# Patient Record
Sex: Female | Born: 1965 | Race: Black or African American | Hispanic: No | Marital: Married | Smoking: Never smoker
Health system: Southern US, Community
[De-identification: ages and names within clinical notes are randomized; demographics above are authoritative.]

## PROBLEM LIST (undated history)

## (undated) DIAGNOSIS — J309 Allergic rhinitis, unspecified: Secondary | ICD-10-CM

## (undated) DIAGNOSIS — M62830 Muscle spasm of back: Secondary | ICD-10-CM

## (undated) DIAGNOSIS — J342 Deviated nasal septum: Secondary | ICD-10-CM

## (undated) DIAGNOSIS — Z9889 Other specified postprocedural states: Secondary | ICD-10-CM

## (undated) DIAGNOSIS — J019 Acute sinusitis, unspecified: Secondary | ICD-10-CM

## (undated) DIAGNOSIS — I471 Supraventricular tachycardia, unspecified: Secondary | ICD-10-CM

## (undated) DIAGNOSIS — J341 Cyst and mucocele of nose and nasal sinus: Secondary | ICD-10-CM

## (undated) DIAGNOSIS — T50905A Adverse effect of unspecified drugs, medicaments and biological substances, initial encounter: Secondary | ICD-10-CM

## (undated) DIAGNOSIS — K319 Disease of stomach and duodenum, unspecified: Secondary | ICD-10-CM

## (undated) DIAGNOSIS — Z6835 Body mass index (BMI) 35.0-35.9, adult: Secondary | ICD-10-CM

## (undated) DIAGNOSIS — J020 Streptococcal pharyngitis: Secondary | ICD-10-CM

## (undated) DIAGNOSIS — L732 Hidradenitis suppurativa: Secondary | ICD-10-CM

## (undated) DIAGNOSIS — I10 Essential (primary) hypertension: Secondary | ICD-10-CM

## (undated) DIAGNOSIS — M549 Dorsalgia, unspecified: Secondary | ICD-10-CM

## (undated) DIAGNOSIS — R7301 Impaired fasting glucose: Secondary | ICD-10-CM

## (undated) DIAGNOSIS — J029 Acute pharyngitis, unspecified: Secondary | ICD-10-CM

## (undated) DIAGNOSIS — E559 Vitamin D deficiency, unspecified: Secondary | ICD-10-CM

## (undated) HISTORY — DX: Impaired fasting glucose: R73.01

## (undated) HISTORY — DX: Hidradenitis suppurativa: L73.2

## (undated) HISTORY — DX: Acute pharyngitis, unspecified: J02.9

## (undated) HISTORY — DX: Body mass index (BMI) 35.0-35.9, adult: Z68.35

## (undated) HISTORY — DX: Adverse effect of unspecified drugs, medicaments and biological substances, initial encounter: K31.9

## (undated) HISTORY — DX: Vitamin D deficiency, unspecified: E55.9

## (undated) HISTORY — DX: Streptococcal pharyngitis: J02.0

## (undated) HISTORY — DX: Muscle spasm of back: M62.830

## (undated) HISTORY — DX: Deviated nasal septum: J34.2

## (undated) HISTORY — DX: Adverse effect of unspecified drugs, medicaments and biological substances, initial encounter: T50.905A

## (undated) HISTORY — PX: ABDOMINAL HYSTERECTOMY: SHX81

## (undated) HISTORY — DX: Allergic rhinitis, unspecified: J30.9

## (undated) HISTORY — DX: Supraventricular tachycardia, unspecified: I47.10

## (undated) HISTORY — DX: Cyst and mucocele of nose and nasal sinus: J34.1

## (undated) HISTORY — DX: Acute sinusitis, unspecified: J01.90

## (undated) HISTORY — DX: Dorsalgia, unspecified: M54.9

---

## 2006-12-18 ENCOUNTER — Emergency Department (HOSPITAL_COMMUNITY): Admission: EM | Admit: 2006-12-18 | Discharge: 2006-12-18 | Payer: Self-pay | Admitting: Emergency Medicine

## 2017-04-20 ENCOUNTER — Emergency Department (HOSPITAL_COMMUNITY)

## 2017-04-20 ENCOUNTER — Emergency Department (HOSPITAL_COMMUNITY)
Admission: EM | Admit: 2017-04-20 | Discharge: 2017-04-20 | Disposition: A | Discharge status: Home / Self Care | Attending: Emergency Medicine | Admitting: Emergency Medicine

## 2017-04-20 ENCOUNTER — Encounter (HOSPITAL_COMMUNITY): Payer: Self-pay | Admitting: Neurology

## 2017-04-20 DIAGNOSIS — L02411 Cutaneous abscess of right axilla: Secondary | ICD-10-CM | POA: Diagnosis not present

## 2017-04-20 DIAGNOSIS — L732 Hidradenitis suppurativa: Secondary | ICD-10-CM | POA: Insufficient documentation

## 2017-04-20 DIAGNOSIS — I10 Essential (primary) hypertension: Secondary | ICD-10-CM | POA: Insufficient documentation

## 2017-04-20 DIAGNOSIS — L02419 Cutaneous abscess of limb, unspecified: Secondary | ICD-10-CM

## 2017-04-20 DIAGNOSIS — R002 Palpitations: Secondary | ICD-10-CM | POA: Diagnosis present

## 2017-04-20 HISTORY — DX: Essential (primary) hypertension: I10

## 2017-04-20 LAB — BASIC METABOLIC PANEL
ANION GAP: 7 (ref 5–15)
BUN: 12 mg/dL (ref 6–20)
CHLORIDE: 105 mmol/L (ref 101–111)
CO2: 27 mmol/L (ref 22–32)
Calcium: 9.2 mg/dL (ref 8.9–10.3)
Creatinine, Ser: 0.79 mg/dL (ref 0.44–1.00)
GFR calc Af Amer: >60 mL/min (ref >60)
GLUCOSE: 96 mg/dL (ref 65–99)
POTASSIUM: 3.3 mmol/L — AB (ref 3.5–5.1)
SODIUM: 139 mmol/L (ref 135–145)

## 2017-04-20 LAB — CBC
HEMATOCRIT: 37.3 % (ref 36.0–46.0)
HEMOGLOBIN: 12.6 g/dL (ref 12.0–15.0)
MCH: 27.8 pg (ref 26.0–34.0)
MCHC: 33.8 g/dL (ref 30.0–36.0)
MCV: 82.2 fL (ref 78.0–100.0)
Platelets: 207 10*3/uL (ref 150–400)
RBC: 4.54 MIL/uL (ref 3.87–5.11)
RDW: 12.7 % (ref 11.5–15.5)
WBC: 9.4 10*3/uL (ref 4.0–10.5)

## 2017-04-20 LAB — I-STAT TROPONIN, ED: Troponin i, poc: 0 ng/mL (ref 0.00–0.08)

## 2017-04-20 MED ORDER — CLINDAMYCIN HCL 150 MG PO CAPS: 150.0000 mg | ORAL_CAPSULE | Freq: Four times a day (QID) | ORAL | 0 refills | Status: DC

## 2017-04-20 NOTE — Discharge Instructions (Signed)
Take all antibiotics as prescribed Use warm compresses as frequently as possible and right underarm area. Return if you are having increased redness, swelling, or pain. Call number on discharge instructions to tablets primary care.

## 2017-04-20 NOTE — ED Triage Notes (Signed)
Pt reports palpations x 1 week, with "deep" cp when it occurs. Denies any pain now. Is on metoprolol, same dose, but pill size got larger recently, no change in dose. Is also here for boil under right arm, draining, not getting better.

## 2017-04-20 NOTE — ED Provider Notes (Addendum)
[facility] EMERGENCY DEPARTMENT Provider Note   CSN: [id number] Arrival date & time: 04/20/17  1256     History   Chief Complaint Chief Complaint  Patient presents with  . Palpitations    HPI Annette Dunn is a 51 y.o. female.  HPI 51 year old female history of hypertension and hidradenitis presents today complaining of palpitations.  They have been present over about a month.  They come and go and last seconds.  She has no associated dyspnea.  She has some occasional sharp pains in her chest.  She did have these in the past and had a monitor placed that did not show anything.  She also is complaining of some swelling and drainage under her right arm.  She has a history of hidradenitis and had surgery over 10 years ago.  She has noticed some ongoing swelling over the past couple months and is now having some drainage.  She has not had any fever or chills.  There is some associated pain.  She is concerned because of the bloody drainage.  She has recently moved to the area and does not have medical care here.  She was previously a Hotel managermilitary dependent.  Headache, head injury, dyspnea, abdominal pain, fever, chills, red streaking, or history of sepsis Past Medical History:  Diagnosis Date  . Hypertension     There are no active problems to display for this patient.   Past Surgical History:  Procedure Laterality Date  . ABDOMINAL HYSTERECTOMY      OB History    No data available       Home Medications    Prior to Admission medications   Not on File    Family History No family history on file.  Social History Social History  Substance Use Topics  . Smoking status: Never Smoker  . Smokeless tobacco: Not on file  . Alcohol use No     Allergies   Doxycycline   Review of Systems Review of Systems  All other systems reviewed and are negative.    Physical Exam Updated Vital Signs BP (!) 151/80 (BP Location: Right Arm)   Pulse 89   Temp  98.9 F (37.2 C) (Oral)   Resp 18   Ht 1.626 m (5\' 4" )   Wt 88.9 kg (196 lb)   SpO2 99%   BMI 33.64 kg/m   Physical Exam  Constitutional: She is oriented to person, place, and time. She appears well-developed and well-nourished. No distress.  HENT:  Head: Normocephalic and atraumatic.  Right Ear: External ear normal.  Left Ear: External ear normal.  Nose: Nose normal.  Eyes: Pupils are equal, round, and reactive to light. Conjunctivae and EOM are normal.  Neck: Normal range of motion. Neck supple.  Cardiovascular: Normal rate, regular rhythm, normal heart sounds and intact distal pulses.   Pulmonary/Chest: Effort normal.  Abdominal: Soft. Bowel sounds are normal.  Musculoskeletal: Normal range of motion.  Neurological: She is alert and oriented to person, place, and time. She exhibits normal muscle tone. Coordination normal.  Skin: Skin is warm and dry.  Scarring in right axilla noted.  Area that is open and draining without underlying fluctuance.  There is a small area with a head inferior to this.  There is no significant erythema or warmth.  Psychiatric: She has a normal mood and affect. Her behavior is normal. Thought content normal.  Nursing note and vitals reviewed.    ED Treatments / Results  Labs (all labs ordered  are listed, but only abnormal results are displayed) Labs Reviewed  BASIC METABOLIC PANEL - Abnormal; Notable for the following:       Result Value   Potassium 3.3 (*)    All other components within normal limits  CBC  I-STAT TROPONIN, ED    EKG  EKG Interpretation  Date/Time:  Wednesday April 20 2017 13:14:48 EDT Ventricular Rate:  99 PR Interval:  152 QRS Duration: 82 QT Interval:  348 QTC Calculation: 446 R Axis:   46 Text Interpretation:  Normal sinus rhythm Normal ECG Confirmed by Margarita Grizzle [phone]) on 04/20/2017 4:36:55 PM       Radiology Dg Chest 2 View  Result Date: 04/20/2017 CLINICAL DATA:  Palpitations over the past 1-1/2  weeks. Current history of hypertension. EXAM: CHEST  2 VIEW COMPARISON:  None. FINDINGS: Cardiomediastinal silhouette unremarkable. Lungs clear. Bronchovascular markings normal. Pulmonary vascularity normal. No visible pleural effusions. No pneumothorax. Visualized bony thorax intact. IMPRESSION: No acute cardiopulmonary disease. Electronically Signed   By: Hulan Saas M.D.   On: 04/20/2017 13:36    Procedures Procedures (including critical care time)  Medications Ordered in ED Medications - No data to display   Initial Impression / Assessment and Plan / ED Course  I have reviewed the triage vital signs and the nursing notes.  Pertinent labs & imaging results that were available during my care of the patient were reviewed by me and considered in my medical decision making (see chart for details).     1 palpitations patient with normal sinus rhythm here.  Chest x-Kelsa Jaworowski is clear.  I have low index of suspicion for this being due to any cardiology or cardiac disease.  She describes simple palpitations.  I discussed possible etiologies and need for follow-up.  She will be referred to cardiology.  We have discussed return precautions such as shortness of breath or chest pain and need for close follow-up and she voiced understanding. 2 hidradenitis with abscess.  Discussed with patient and had shared decision making conversation regarding I&D now or treat with antibiotics.  She would prefer to treat with antibiotics and referral for follow-up.  Has been allergic to doxycycline in the past.  Also discussed therapy with warm compresses and return precautions and she voices understanding. Final Clinical Impressions(s) / ED Diagnoses   Final diagnoses:  Palpitations  Axillary abscess  Hydradenitis    New Prescriptions New Prescriptions   CLINDAMYCIN (CLEOCIN) 150 MG CAPSULE    Take 1 capsule (150 mg total) by mouth every 6 (six) hours.     Margarita Grizzle, MD 04/20/17 [place]    Margarita Grizzle, MD 04/20/17 [phone]

## 2019-02-10 IMAGING — CR DG CHEST 2V
2 series · 2 of 2 positions shown · non-contrast
Comparison: None.

CLINICAL DATA: Palpitations over the past 1-1/2 weeks. Current
history of hypertension.

EXAM:
CHEST  2 VIEW

[chest pa]
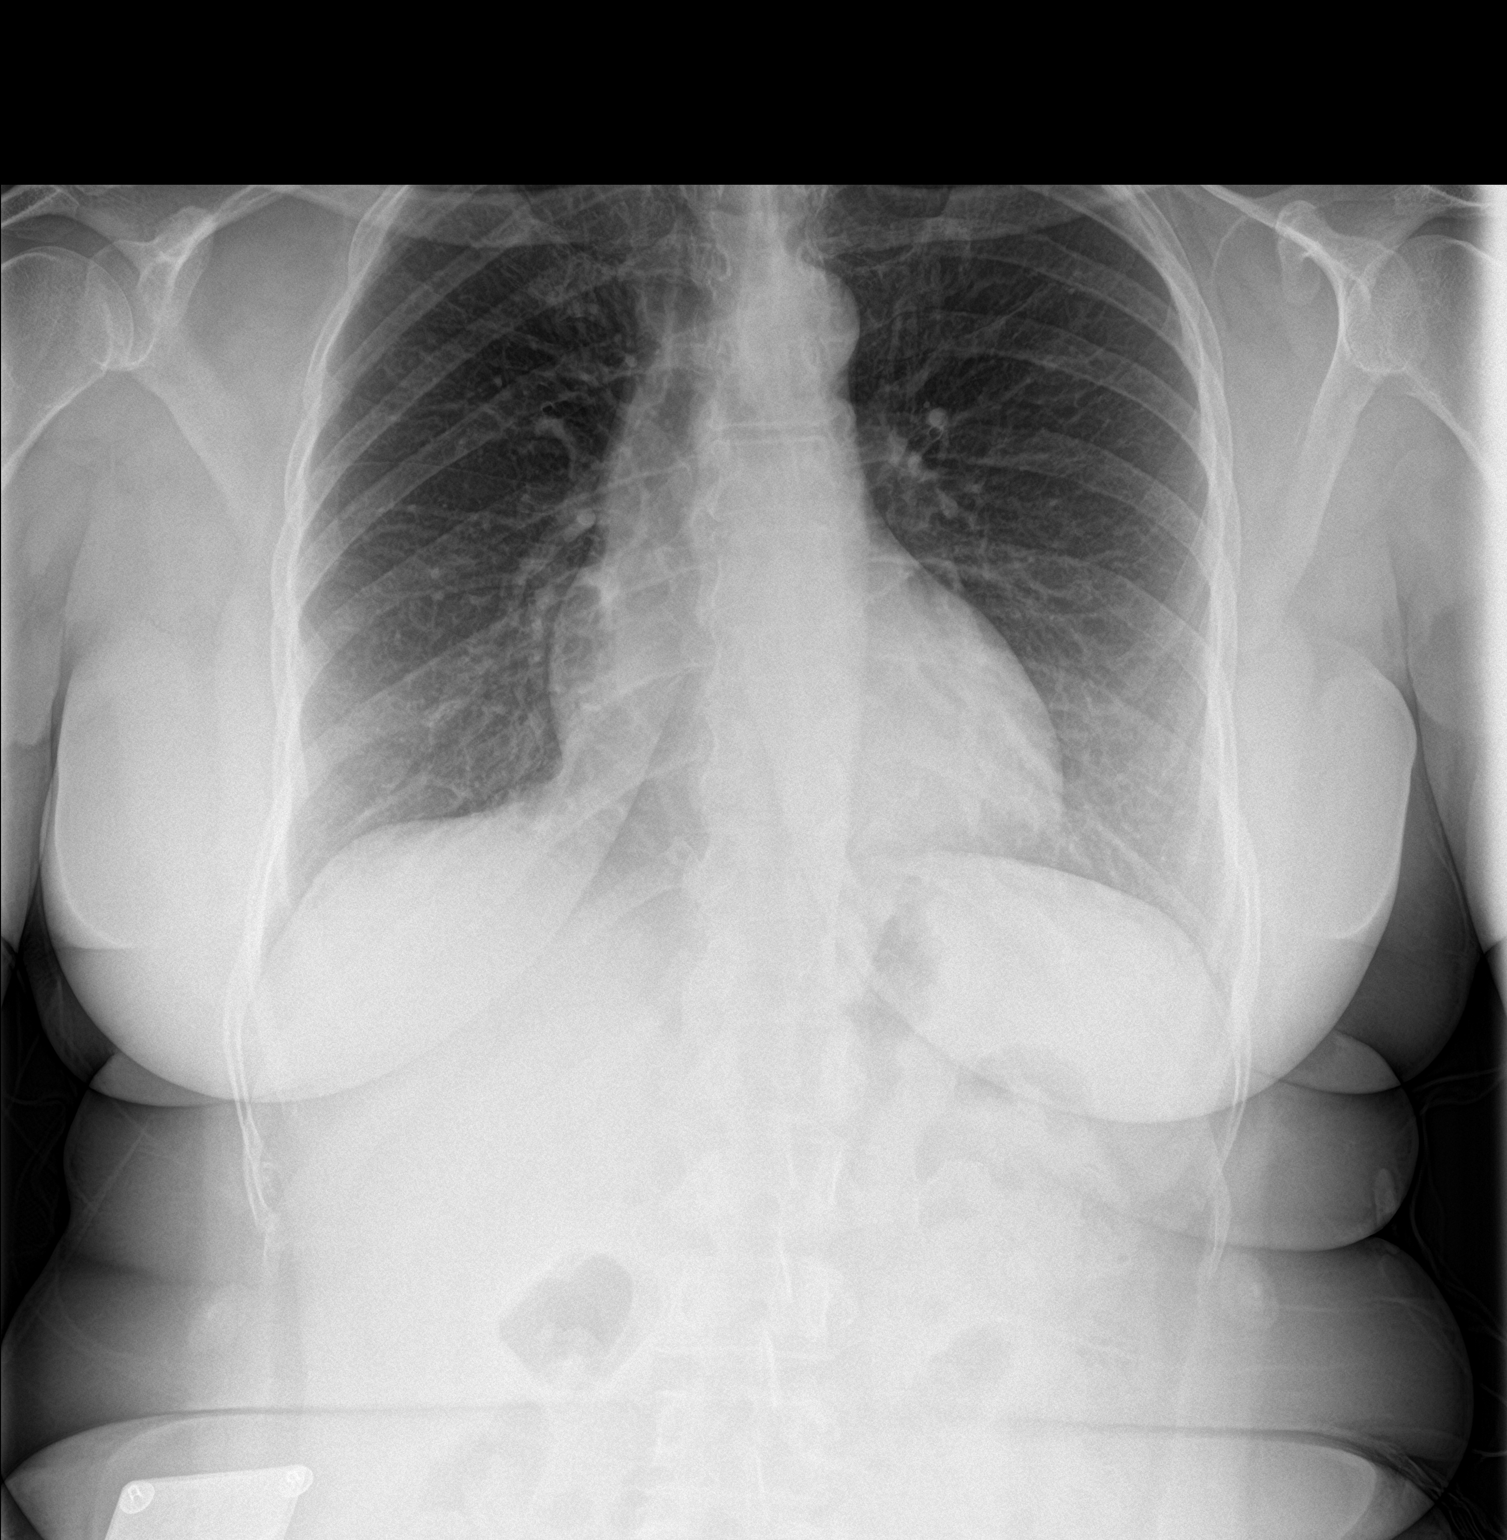

[chest lat]
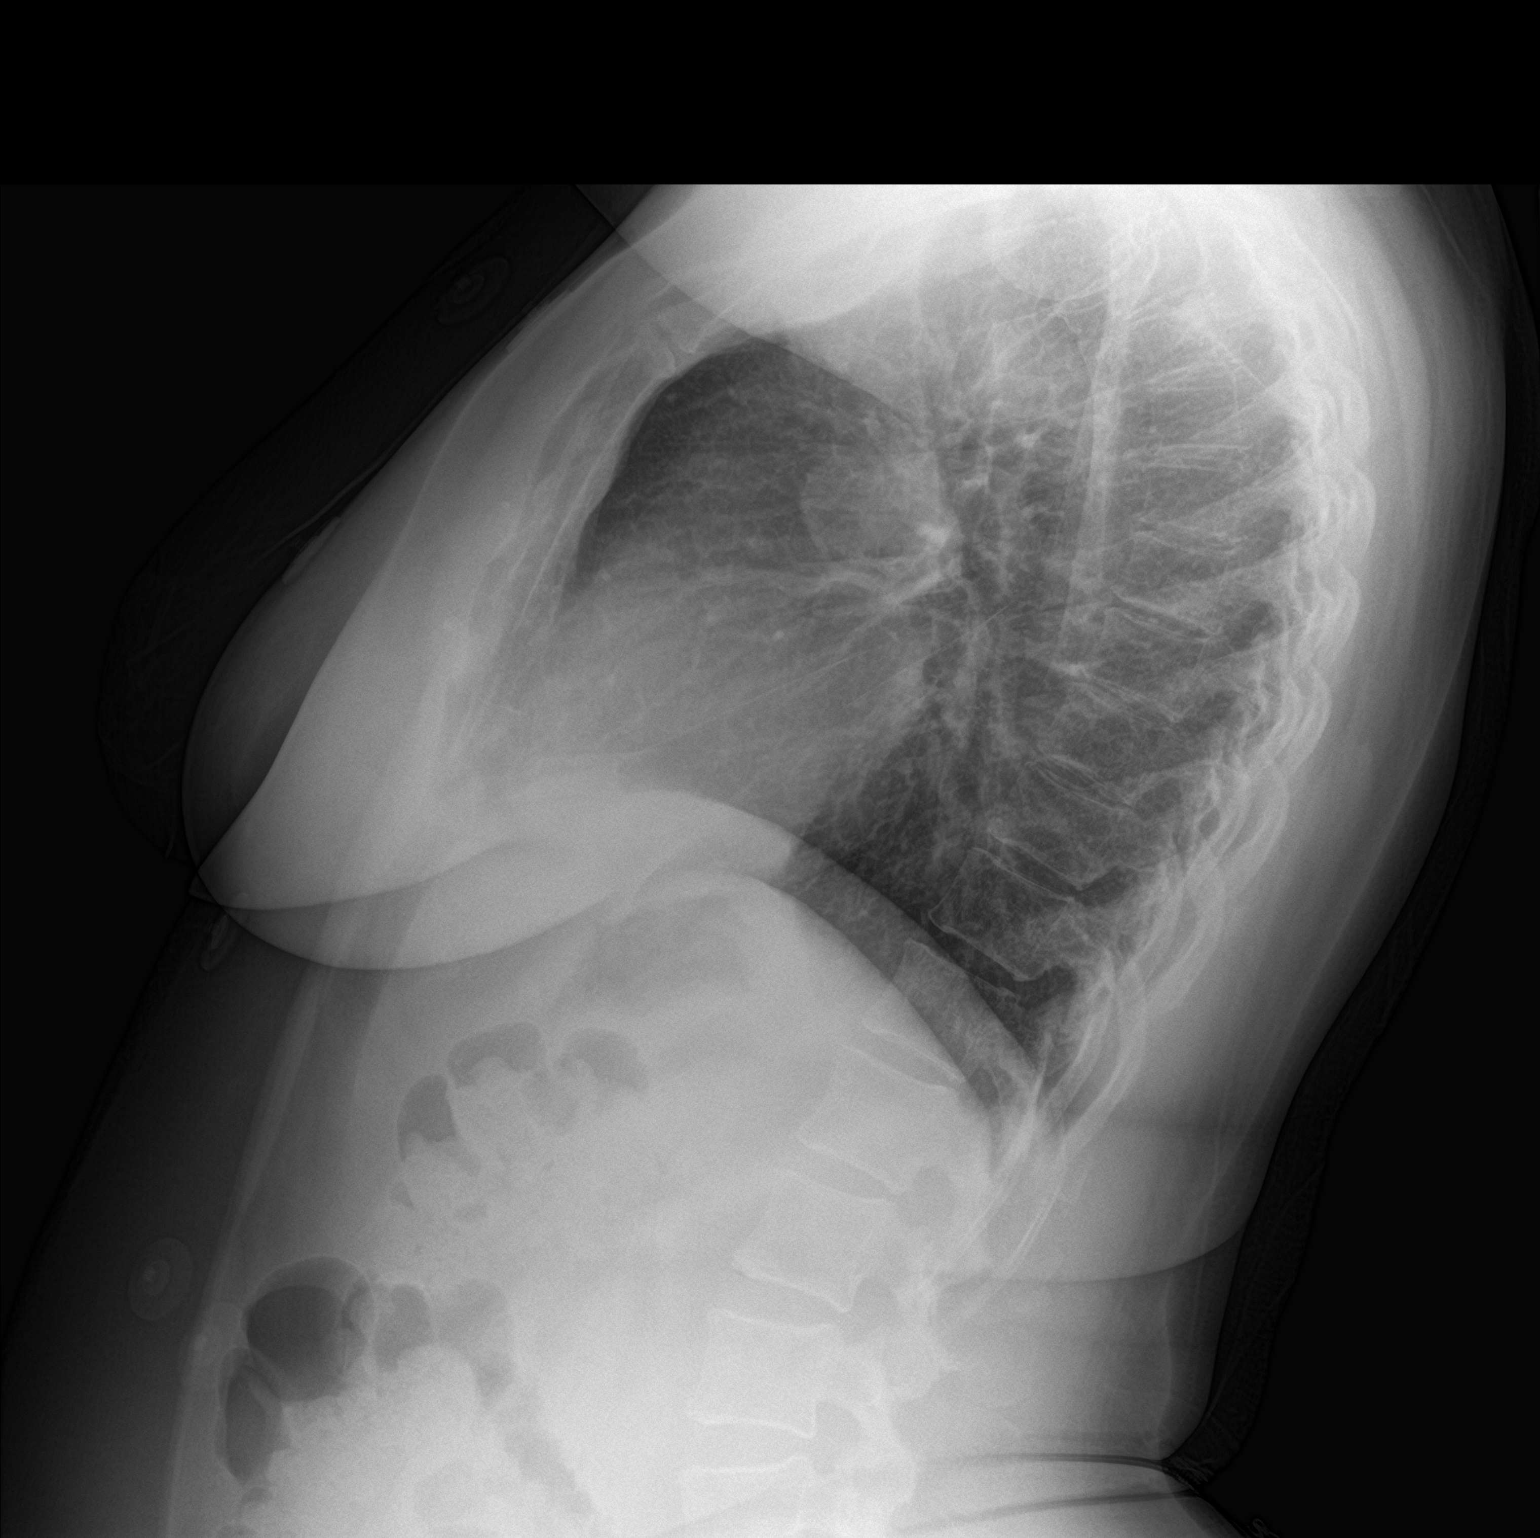

[2 of 2 positions shown; findings below may reference images not displayed]

FINDINGS: Cardiomediastinal silhouette unremarkable. Lungs clear.
Bronchovascular markings normal. Pulmonary vascularity normal. No
visible pleural effusions. No pneumothorax. Visualized bony thorax
intact.
IMPRESSION: No acute cardiopulmonary disease.

## 2020-11-18 NOTE — Patient Instructions (Signed)

## 2020-11-19 ENCOUNTER — Encounter: Payer: Self-pay | Admitting: Nurse Practitioner

## 2020-11-19 ENCOUNTER — Ambulatory Visit (INDEPENDENT_AMBULATORY_CARE_PROVIDER_SITE_OTHER): Admitting: Nurse Practitioner

## 2020-11-19 ENCOUNTER — Other Ambulatory Visit: Payer: Self-pay

## 2020-11-19 VITALS — BP 156/85 | HR 73 | Ht 64.0 in | Wt 197.0 lb

## 2020-11-19 DIAGNOSIS — R7989 Other specified abnormal findings of blood chemistry: Secondary | ICD-10-CM

## 2020-11-19 NOTE — Progress Notes (Signed)
11/20/2020     Endocrinology Consult Note    Subjective:    Patient ID: Annette Dunn, female    DOB: [birth year], PCP Jonathon Bellows, DO.   Past Medical History:  Diagnosis Date  . Hypertension     Past Surgical History:  Procedure Laterality Date  . ABDOMINAL HYSTERECTOMY      Social History   Socioeconomic History  . Marital status: Married    Spouse name: Not on file  . Number of children: Not on file  . Years of education: Not on file  . Highest education level: Not on file  Occupational History  . Not on file  Tobacco Use  . Smoking status: Never Smoker  . Smokeless tobacco: Not on file  Substance and Sexual Activity  . Alcohol use: No  . Drug use: Not on file  . Sexual activity: Not on file  Other Topics Concern  . Not on file  Social History Narrative  . Not on file   Social Determinants of Health   Financial Resource Strain: Not on file  Food Insecurity: Not on file  Transportation Needs: Not on file  Physical Activity: Not on file  Stress: Not on file  Social Connections: Not on file    Family History  Problem Relation Age of Onset  . Thyroid disease Mother   . Hypertension Mother   . Thyroid disease Father   . Hypertension Father     Outpatient Encounter Medications as of 11/19/2020  Medication Sig  . hydrochlorothiazide (HYDRODIURIL) 25 MG tablet daily.  . metoprolol succinate (TOPROL-XL) 50 MG 24 hr tablet Take by mouth daily.  . montelukast (SINGULAIR) 10 MG tablet daily.  . Multiple Vitamin (MULTIVITAMIN) tablet Take 1 tablet by mouth daily.  . [DISCONTINUED] clindamycin (CLEOCIN) 150 MG capsule Take 1 capsule (150 mg total) by mouth every 6 (six) hours.   No facility-administered encounter medications on file as of 11/19/2020.    ALLERGIES: Allergies  Allergen Reactions  . Doxycycline Rash    VACCINATION STATUS: Immunization History  Administered Date(s) Administered  . PFIZER(Purple Top)SARS-COV-2 Vaccination  08/24/2019, 09/14/2019     HPI  Annette Dunn is 55 y.o. female who presents today with a medical history as above. she is being seen in consultation for hyperthyroidism requested by Jonathon Bellows, DO.  she has been dealing with symptoms of intermittent palpitations, vertigo, anxiety on and off since 2004 when she had her first incidence of suppressed TSH.  her most recent thyroid labs revealed suppressed TSH of 0.22 on 10/12/20.  She has moved all over the country due to her husband being in the [employer].  She reports seeing several different specialists and undergoing several different tests for her thyroid.  She says she has had multiple uptake and scans in the past which did not show hyperthyroidism and she has had thyroid ultrasound showing suspicious nodule which needed FNA which came back inconclusive due to inadequate tissue sampling.  None of these records are available to view today.  She will call and obtain records of these procedures for our review.  she denies dysphagia, choking, shortness of breath, no recent voice change.    she does have family history of thyroid dysfunction in her dad and daughter (both hyperthyroidism needing RAI ablation), but denies family hx of thyroid cancer. she denies personal history of goiter. she is not on any anti-thyroid medications nor on any thyroid hormone supplements. Denies use of Biotin containing supplements.  she is  willing to proceed with appropriate work up and therapy for thyrotoxicosis.   Review of systems  Constitutional: + Minimally fluctuating body weight, current Body mass index is 33.81 kg/m., no fatigue, no subjective hyperthermia, no subjective hypothermia Eyes: no blurry vision, no xerophthalmia ENT: no sore throat, no nodules palpated in throat, no dysphagia/odynophagia, + intermittent hoarseness Cardiovascular: no chest pain, no shortness of breath, + intermittent palpitations, no leg swelling Respiratory: no cough, no shortness of  breath Gastrointestinal: no nausea/vomiting/diarrhea Musculoskeletal: no muscle/joint aches Skin: no rashes, no hyperemia Neurological: no tremors, no numbness, no tingling, no dizziness (has recently had bouts of vertigo) Psychiatric: no depression, + anxiety   Objective:    BP (!) 156/85   Pulse 73   Ht 5\' 4"  (1.626 m)   Wt 197 lb (89.4 kg)   BMI 33.81 kg/m   Wt Readings from Last 3 Encounters:  11/19/20 197 lb (89.4 kg)  04/20/17 196 lb (88.9 kg)     BP Readings from Last 3 Encounters:  11/19/20 (!) 156/85  04/20/17 (!) 145/79                        Physical Exam- Limited  Constitutional:  Body mass index is 33.81 kg/m. , not in acute distress, normal state of mind Eyes:  EOMI, no exophthalmos Neck: Supple Thyroid: + mild goiter Cardiovascular: RRR, no murmurs, rubs, or gallops, no edema Respiratory: Adequate breathing efforts, no crackles, rales, rhonchi, or wheezing Musculoskeletal: no gross deformities, strength intact in all four extremities, no gross restriction of joint movements Skin:  no rashes, no hyperemia Neurological: no tremor with outstretched hands   CMP     Component Value Date/Time   NA 139 04/20/2017 1317   K 3.3 (L) 04/20/2017 1317   CL 105 04/20/2017 1317   CO2 27 04/20/2017 1317   GLUCOSE 96 04/20/2017 1317   BUN 12 04/20/2017 1317   CREATININE 0.79 04/20/2017 1317   CALCIUM 9.2 04/20/2017 1317   GFRNONAA >60 04/20/2017 1317   GFRAA >60 04/20/2017 1317     CBC    Component Value Date/Time   WBC 9.4 04/20/2017 1317   RBC 4.54 04/20/2017 1317   HGB 12.6 04/20/2017 1317   HCT 37.3 04/20/2017 1317   PLT 207 04/20/2017 1317   MCV 82.2 04/20/2017 1317   MCH 27.8 04/20/2017 1317   MCHC 33.8 04/20/2017 1317   RDW 12.7 04/20/2017 1317     Diabetic Labs (most recent): No results found for: HGBA1C  Lipid Panel  No results found for: CHOL, TRIG, HDL, CHOLHDL, VLDL, LDLCALC, LDLDIRECT, LABVLDL   No results found for: TSH,  FREET4      Assessment & Plan:   1. Abnormal TSH  she is being seen at a kind request of 04/22/2017, DO. her history and most recent labs are reviewed, and she was examined clinically. However, more information is needed to determine the etiology and severity of her thyroid dysfunction.  I will repeat her thyroid function tests today, including antibody testing for autoimmune thyroid dysfunction.  She is asked to have her previous endocrinology records sent to our office to review to prevent unnecessary testing.  I did order repeat thyroid ultrasound given her history of thyroid nodule requiring FNA biopsy and mild goiter.  The potential risks of untreated thyrotoxicosis and the need for definitive therapy have been discussed in detail with her, and she agrees to proceed with diagnostic workup and treatment plan.  Options of therapy for hyperthyroidism are discussed with her.  We discussed the option of treating it with medications including methimazole or PTU which may have side effects including rash, transaminitis, and bone marrow suppression.  We also discussed the option of definitive therapy with RAI ablation of the thyroid. If she is found to have primary hyperthyroidism from Graves' disease , toxic multinodular goiter or toxic nodular goiter the preferred modality of treatment would be I-131 thyroid ablation. Surgery is another choice of treatment in some cases, in her case surgery is not a good fit for presentation with only mild goiter.  -Patient is made aware of the high likelihood of post ablative hypothyroidism with subsequent need for lifelong thyroid hormone replacement. sheunderstands this outcome and she is  willing to proceed.      she will return in 3 weeks for treatment decision.  I did not initiate any new prescriptions today.    -Patient is advised to maintain close follow up with Jonathon Bellows, DO for primary care needs.   - Time spent with the patient: 60  minutes, of which >50% was spent in obtaining information about her symptoms, reviewing her previous labs, evaluations, and treatments, counseling her about her hyperthyroidism , and developing a plan to confirm the diagnosis and long term treatment as necessary. Please refer to "Patient Self Inventory" in the Media tab for reviewed elements of pertinent patient history.  Annette Dunn participated in the discussions, expressed understanding, and voiced agreement with the above plans.  All questions were answered to her satisfaction. she is encouraged to contact clinic should she have any questions or concerns prior to her return visit.   Follow up plan: Return in about 3 weeks (around 12/10/2020) for Thyroid follow up, Previsit labs, thyroid ultrasound.   Thank you for involving me in the care of this pleasant patient, and I will continue to update you with her progress.    Ronny Bacon, [facility] [address] [place], [place] [address] Phone: [phone] Fax: [phone]  11/20/2020, 11:19 AM

## 2020-11-23 LAB — THYROID PEROXIDASE ANTIBODY: Thyroperoxidase Ab SerPl-aCnc: <8 IU/mL (ref 0–34)

## 2020-11-23 LAB — TSH: TSH: 0.833 u[IU]/mL (ref 0.450–4.500)

## 2020-11-23 LAB — THYROGLOBULIN ANTIBODY: Thyroglobulin Antibody: <1 IU/mL (ref 0.0–0.9)

## 2020-11-23 LAB — T3, FREE: T3, Free: 3.3 pg/mL (ref 2.0–4.4)

## 2020-11-23 LAB — T4, FREE: Free T4: 1.16 ng/dL (ref 0.82–1.77)

## 2020-12-15 ENCOUNTER — Ambulatory Visit: Admitting: Nurse Practitioner

## 2020-12-29 ENCOUNTER — Ambulatory Visit: Payer: Self-pay | Admitting: Nurse Practitioner

## 2021-01-23 ENCOUNTER — Ambulatory Visit: Payer: Self-pay | Admitting: Nurse Practitioner

## 2021-01-26 ENCOUNTER — Ambulatory Visit: Payer: Self-pay | Admitting: Nurse Practitioner

## 2021-02-09 ENCOUNTER — Ambulatory Visit (INDEPENDENT_AMBULATORY_CARE_PROVIDER_SITE_OTHER): Admitting: Nurse Practitioner

## 2021-02-09 ENCOUNTER — Encounter: Payer: Self-pay | Admitting: Nurse Practitioner

## 2021-02-09 ENCOUNTER — Other Ambulatory Visit: Payer: Self-pay

## 2021-02-09 VITALS — BP 153/95 | HR 75 | Ht 64.0 in | Wt 199.0 lb

## 2021-02-09 DIAGNOSIS — R7989 Other specified abnormal findings of blood chemistry: Secondary | ICD-10-CM

## 2021-02-09 NOTE — Progress Notes (Signed)
02/09/2021     Endocrinology Follow Up Note    Subjective:    Patient ID: Annette Dunn, female    DOB: 15-Jul-1965, PCP Annette Bellows, DO.   Past Medical History:  Diagnosis Date   Hypertension     Past Surgical History:  Procedure Laterality Date   ABDOMINAL HYSTERECTOMY      Social History   Socioeconomic History   Marital status: Married    Spouse name: Not on file   Number of children: Not on file   Years of education: Not on file   Highest education level: Not on file  Occupational History   Not on file  Tobacco Use   Smoking status: Never   Smokeless tobacco: Not on file  Substance and Sexual Activity   Alcohol use: No   Drug use: Not on file   Sexual activity: Not on file  Other Topics Concern   Not on file  Social History Narrative   Not on file   Social Determinants of Health   Financial Resource Strain: Not on file  Food Insecurity: Not on file  Transportation Needs: Not on file  Physical Activity: Not on file  Stress: Not on file  Social Connections: Not on file    Family History  Problem Relation Age of Onset   Thyroid disease Mother    Hypertension Mother    Thyroid disease Father    Hypertension Father     Outpatient Encounter Medications as of 02/09/2021  Medication Sig   hydrochlorothiazide (HYDRODIURIL) 25 MG tablet daily.   metoprolol succinate (TOPROL-XL) 50 MG 24 hr tablet Take by mouth daily.   montelukast (SINGULAIR) 10 MG tablet daily as needed.   Multiple Vitamin (MULTIVITAMIN) tablet Take 1 tablet by mouth daily.   No facility-administered encounter medications on file as of 02/09/2021.    ALLERGIES: Allergies  Allergen Reactions   Doxycycline Rash    VACCINATION STATUS: Immunization History  Administered Date(s) Administered   PFIZER(Purple Top)SARS-COV-2 Vaccination 08/24/2019, 09/14/2019     HPI  Annette Dunn is 55 y.o. female who presents today with a medical history as above. she is being seen  in follow up after being seen in consultation for Annette Dunn requested by Annette Bellows, DO.  she has been dealing with symptoms of intermittent palpitations, vertigo, anxiety on and off since 2004 when she had her first incidence of suppressed TSH.  her most recent thyroid labs revealed suppressed TSH of 0.22 on 10/12/20.  She has moved all over the country due to her husband being in the [employer].  She reports seeing several different specialists and undergoing several different tests for her thyroid.  She says she has had multiple uptake and scans in the past which did not show Annette Dunn and she has had thyroid ultrasound showing suspicious nodule which needed FNA which came back inconclusive due to inadequate tissue sampling.  None of these records are available to view today.  She will call and obtain records of these procedures for our review.  she denies dysphagia, choking, shortness of breath, no recent voice change.    she does have family history of thyroid dysfunction in her dad and daughter (both Annette Dunn needing RAI ablation), but denies family hx of thyroid cancer. she denies personal history of goiter. she is not on any anti-thyroid medications nor on any thyroid hormone supplements. Denies use of Biotin containing supplements.  she is willing to proceed with appropriate work up and therapy for thyrotoxicosis.  Review of systems  Constitutional: + Minimally fluctuating body weight, current Body mass index is 34.16 kg/m., no fatigue, no subjective hyperthermia, no subjective hypothermia Eyes: no blurry vision, no xerophthalmia ENT: no sore throat, no nodules palpated in throat, no dysphagia/odynophagia, + intermittent hoarseness-improved Cardiovascular: no chest pain, no shortness of breath, + intermittent palpitations-much better, no leg swelling Respiratory: no cough, no shortness of breath Gastrointestinal: no nausea/vomiting/diarrhea Musculoskeletal: no muscle/joint  aches Skin: no rashes, no hyperemia Neurological: no tremors, no numbness, no tingling, no dizziness (has recently had bouts of vertigo) thinks this is related to her sinuses Psychiatric: no depression, + anxiety-improved   Objective:    BP (!) 153/95   Pulse 75   Ht 5\' 4"  (1.626 m)   Wt 199 lb (90.3 kg)   BMI 34.16 kg/m   Wt Readings from Last 3 Encounters:  02/09/21 199 lb (90.3 kg)  11/19/20 197 lb (89.4 kg)  04/20/17 196 lb (88.9 kg)     BP Readings from Last 3 Encounters:  02/09/21 (!) 153/95  11/19/20 (!) 156/85  04/20/17 (!) 145/79                        Physical Exam- Limited  Constitutional:  Body mass index is 34.16 kg/m. , not in acute distress, normal state of mind Eyes:  EOMI, no exophthalmos Neck: Supple Cardiovascular: RRR, no murmurs, rubs, or gallops, no edema Respiratory: Adequate breathing efforts, no crackles, rales, rhonchi, or wheezing Musculoskeletal: no gross deformities, strength intact in all four extremities, no gross restriction of joint movements Skin:  no rashes, no hyperemia Neurological: no tremor with outstretched hands   CMP     Component Value Date/Time   NA 139 04/20/2017 1317   K 3.3 (L) 04/20/2017 1317   CL 105 04/20/2017 1317   CO2 27 04/20/2017 1317   GLUCOSE 96 04/20/2017 1317   BUN 12 04/20/2017 1317   CREATININE 0.79 04/20/2017 1317   CALCIUM 9.2 04/20/2017 1317   GFRNONAA >60 04/20/2017 1317   GFRAA >60 04/20/2017 1317     CBC    Component Value Date/Time   WBC 9.4 04/20/2017 1317   RBC 4.54 04/20/2017 1317   HGB 12.6 04/20/2017 1317   HCT 37.3 04/20/2017 1317   PLT 207 04/20/2017 1317   MCV 82.2 04/20/2017 1317   MCH 27.8 04/20/2017 1317   MCHC 33.8 04/20/2017 1317   RDW 12.7 04/20/2017 1317     Diabetic Labs (most recent): No results found for: HGBA1C  Lipid Panel  No results found for: CHOL, TRIG, HDL, CHOLHDL, VLDL, LDLCALC, LDLDIRECT, LABVLDL   Lab Results  Component Value Date   TSH 0.833  11/20/2020   FREET4 1.16 11/20/2020     Results for Annette Dunn, Annette Dunn (MRN Annette Dunn) as of 02/09/2021 16:12  Ref. Range 11/20/2020 19:00  TSH Latest Ref Range: 0.450 - 4.500 uIU/mL 0.833  Triiodothyronine,Free,Serum Latest Ref Range: 2.0 - 4.4 pg/mL 3.3  T4,Free(Direct) Latest Ref Range: 0.82 - 1.77 ng/dL 01/20/2021  Thyroperoxidase Ab SerPl-aCnc Latest Ref Range: 0 - 34 IU/mL <8  Thyroglobulin Antibody Latest Ref Range: 0.0 - 0.9 IU/mL <1.0   Thyroid 1.60 from 12/18/20  Reason for study: Suppressed TSH- mild goiter Comparison: none Findings: No adenopathy is seen in either jugular chain  Thyroid Echogenicity: Isoechoic  Right lobe: Size: 4.9 x 2.3 x 1.7 cm Nodules: Present  Left lobe: Size: 4.9 x 2.3 x 2.4 cm Nodules: Present  Isthmus: Size: 0.5 cm  Nodules: Absent  Nodule 1: Gland: Left Location: Mid Composition: Mixed cystic and solid 1 pt Echogenicity: Anechoic 0 pts Shape: Wider than tall 0 pts Echogenic Foci: None or large comet tail artifact 0 pts Margin: Smooth well circumscribed 0 pts Other: Measuring up to 1.7 cm  Remaining nodules are primarily isoechoic and anechoic in nature and subcentimeter in size.  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  Assessment & Plan:   1. Abnormal TSH  she is being seen at a kind request of Annette Bellows, DO.  Her follow up labs have returned to normal without any intervention, including her antibody testing, ruling out autoimmune thyroid dysfunction.  Her ultrasound showed multinodular thyroid with 1 nodule which is not suspicious for cancer (I suspect this is the nodule that was previously biopsied which came back as inadequate tissue sampling).  It appears she is likely recovering from acute thyroiditis secondary to a viral illness.    Will recheck thyroid function tests prior to next visit in 6 months for surveillance.  If normal, she can return to annual thyroid function  testing.    -Patient is advised to maintain close follow up with Annette Bellows, DO for primary care needs.    I spent 25 minutes in the care of the patient today including review of labs from Thyroid Function, CMP, and other relevant labs ; imaging/biopsy records (current and previous including abstractions from other facilities); face-to-face time discussing  her lab results and symptoms, medications doses, her options of short and long term treatment based on the latest standards of care / guidelines;   and documenting the encounter.  Annette Dunn  participated in the discussions, expressed understanding, and voiced agreement with the above plans.  All questions were answered to her satisfaction. she is encouraged to contact clinic should she have any questions or concerns prior to her return visit.   Follow up plan: Return in about 6 months (around 08/12/2021) for Thyroid follow up, Previsit labs.   Thank you for involving me in the care of this pleasant patient, and I will continue to update you with her progress.    Ronny Bacon, [facility] [address] [place], [place] [address] Phone: [phone] Fax: [phone]  02/09/2021, 4:20 PM

## 2021-08-05 LAB — TSH: TSH: 0.43 u[IU]/mL — ABNORMAL LOW (ref 0.450–4.500)

## 2021-08-05 LAB — T4, FREE: Free T4: 1.21 ng/dL (ref 0.82–1.77)

## 2021-08-10 ENCOUNTER — Ambulatory Visit (INDEPENDENT_AMBULATORY_CARE_PROVIDER_SITE_OTHER): Admitting: Nurse Practitioner

## 2021-08-10 ENCOUNTER — Encounter: Payer: Self-pay | Admitting: Nurse Practitioner

## 2021-08-10 ENCOUNTER — Other Ambulatory Visit: Payer: Self-pay

## 2021-08-10 VITALS — BP 147/87 | HR 58 | Ht 64.0 in | Wt 210.0 lb

## 2021-08-10 DIAGNOSIS — E0590 Thyrotoxicosis, unspecified without thyrotoxic crisis or storm: Secondary | ICD-10-CM

## 2021-08-10 NOTE — Progress Notes (Signed)
08/10/2021     Endocrinology Follow Up Note    Subjective:    Patient ID: Annette Dunn, female    DOB: [birth year], PCP Jonathon Bellows, DO.   Past Medical History:  Diagnosis Date   Hypertension     Past Surgical History:  Procedure Laterality Date   ABDOMINAL HYSTERECTOMY      Social History   Socioeconomic History   Marital status: Married    Spouse name: Not on file   Number of children: Not on file   Years of education: Not on file   Highest education level: Not on file  Occupational History   Not on file  Tobacco Use   Smoking status: Never    Passive exposure: Past   Smokeless tobacco: Never  Vaping Use   Vaping Use: Never used  Substance and Sexual Activity   Alcohol use: No   Drug use: Never   Sexual activity: Not on file  Other Topics Concern   Not on file  Social History Narrative   Not on file   Social Determinants of Health   Financial Resource Strain: Not on file  Food Insecurity: Not on file  Transportation Needs: Not on file  Physical Activity: Not on file  Stress: Not on file  Social Connections: Not on file    Family History  Problem Relation Age of Onset   Thyroid disease Mother    Hypertension Mother    Thyroid disease Father    Hypertension Father     Outpatient Encounter Medications as of 08/10/2021  Medication Sig   ergocalciferol (VITAMIN D2) 1.25 MG (50000 UT) capsule Take by mouth.   fexofenadine (ALLEGRA) 180 MG tablet Take by mouth as needed.   fluticasone (FLONASE) 50 MCG/ACT nasal spray Place 2 sprays into both nostrils daily.   gabapentin (NEURONTIN) 300 MG capsule Take by mouth as needed.   hydrochlorothiazide (HYDRODIURIL) 25 MG tablet daily.   metoprolol succinate (TOPROL-XL) 50 MG 24 hr tablet Take 25 mg by mouth daily.   montelukast (SINGULAIR) 10 MG tablet daily as needed.   Multiple Vitamin (MULTIVITAMIN) tablet Take 1 tablet by mouth daily.   No facility-administered encounter medications on file  as of 08/10/2021.    ALLERGIES: Allergies  Allergen Reactions   Doxycycline Rash    Other reaction(s): rash/itching    VACCINATION STATUS: Immunization History  Administered Date(s) Administered   PFIZER Comirnaty(Gray Top)Covid-19 Tri-Sucrose Vaccine 08/24/2019, 09/14/2019     HPI  Annette Dunn is 56 y.o. female who presents today with a medical history as above. she is being seen in follow up after being seen in consultation for hyperthyroidism requested by Jonathon Bellows, DO.  she has been dealing with symptoms of intermittent palpitations, vertigo, anxiety on and off since 2004 when she had her first incidence of suppressed TSH.  her most recent thyroid labs revealed suppressed TSH of 0.22 on 10/12/20.  She has moved all over the country due to her husband being in the [employer].  She reports seeing several different specialists and undergoing several different tests for her thyroid.  She says she has had multiple uptake and scans in the past which did not show hyperthyroidism and she has had thyroid ultrasound showing suspicious nodule which needed FNA which came back inconclusive due to inadequate tissue sampling.  None of these records are available to view today.  She will call and obtain records of these procedures for our review.  she denies dysphagia, choking, shortness of  breath, no recent voice change.    she does have family history of thyroid dysfunction in her dad and daughter (both hyperthyroidism needing RAI ablation), but denies family hx of thyroid cancer. she denies personal history of goiter. she is not on any anti-thyroid medications nor on any thyroid hormone supplements. Denies use of Biotin containing supplements.  she is willing to proceed with appropriate work up and therapy for thyrotoxicosis.   Review of systems  Constitutional: + Minimally fluctuating body weight,  current Body mass index is 36.05 kg/m. , no fatigue, no subjective hyperthermia, no subjective  hypothermia Eyes: no blurry vision, no xerophthalmia ENT: no sore throat, no nodules palpated in throat, no dysphagia/odynophagia, mild intermittent hoarseness Cardiovascular: no chest pain, no shortness of breath, no palpitations, no leg swelling Respiratory: no cough, no shortness of breath Gastrointestinal: no nausea/vomiting/diarrhea Musculoskeletal: no muscle/joint aches Skin: no rashes, no hyperemia Neurological: no tremors, no numbness, no tingling, no dizziness Psychiatric: no depression, no anxiety   Objective:    BP (!) 147/87    Pulse (!) 58    Ht 5\' 4"  (1.626 m)    Wt 210 lb (95.3 kg)    SpO2 98%    BMI 36.05 kg/m   Wt Readings from Last 3 Encounters:  08/10/21 210 lb (95.3 kg)  02/09/21 199 lb (90.3 kg)  11/19/20 197 lb (89.4 kg)     BP Readings from Last 3 Encounters:  08/10/21 (!) 147/87  02/09/21 (!) 153/95  11/19/20 (!) 156/85                        Physical Exam- Limited  Constitutional:  Body mass index is 36.05 kg/m. , not in acute distress, normal state of mind Eyes:  EOMI, no exophthalmos Neck: Supple, mild fullness to neck noted Cardiovascular: RRR, no murmurs, rubs, or gallops, no edema Respiratory: Adequate breathing efforts, no crackles, rales, rhonchi, or wheezing Musculoskeletal: no gross deformities, strength intact in all four extremities, no gross restriction of joint movements Skin:  no rashes, no hyperemia Neurological: no tremor with outstretched hands   CMP     Component Value Date/Time   NA 139 04/20/2017 1317   K 3.3 (L) 04/20/2017 1317   CL 105 04/20/2017 1317   CO2 27 04/20/2017 1317   GLUCOSE 96 04/20/2017 1317   BUN 12 04/20/2017 1317   CREATININE 0.79 04/20/2017 1317   CALCIUM 9.2 04/20/2017 1317   GFRNONAA >60 04/20/2017 1317   GFRAA >60 04/20/2017 1317     CBC    Component Value Date/Time   WBC 9.4 04/20/2017 1317   RBC 4.54 04/20/2017 1317   HGB 12.6 04/20/2017 1317   HCT 37.3 04/20/2017 1317   PLT 207  04/20/2017 1317   MCV 82.2 04/20/2017 1317   MCH 27.8 04/20/2017 1317   MCHC 33.8 04/20/2017 1317   RDW 12.7 04/20/2017 1317     Diabetic Labs (most recent): No results found for: HGBA1C  Lipid Panel  No results found for: CHOL, TRIG, HDL, CHOLHDL, VLDL, LDLCALC, LDLDIRECT, LABVLDL   Lab Results  Component Value Date   TSH 0.430 (L) 08/04/2021   TSH 0.833 11/20/2020   FREET4 1.21 08/04/2021   FREET4 1.16 11/20/2020     Results for VICKIE, MELNIK (MRN Annette Dunn) as of 02/09/2021 16:12  Ref. Range 11/20/2020 19:00  TSH Latest Ref Range: 0.450 - 4.500 uIU/mL 0.833  Triiodothyronine,Free,Serum Latest Ref Range: 2.0 - 4.4 pg/mL 3.3  T4,Free(Direct) Latest Ref  Range: 0.82 - 1.77 ng/dL 6.75  Thyroperoxidase Ab SerPl-aCnc Latest Ref Range: 0 - 34 IU/mL <8  Thyroglobulin Antibody Latest Ref Range: 0.0 - 0.9 IU/mL <1.0   Thyroid US from 12/18/20  Reason for study: Suppressed TSH- mild goiter Comparison: none Findings: No adenopathy is seen in either jugular chain  Thyroid Echogenicity: Isoechoic  Right lobe: Size: 4.9 x 2.3 x 1.7 cm Nodules: Present  Left lobe: Size: 4.9 x 2.3 x 2.4 cm Nodules: Present  Isthmus: Size: 0.5 cm Nodules: Absent  Nodule 1: Gland: Left Location: Mid Composition: Mixed cystic and solid 1 pt Echogenicity: Anechoic 0 pts Shape: Wider than tall 0 pts Echogenic Foci: None or large comet tail artifact 0 pts Margin: Smooth well circumscribed 0 pts Other: Measuring up to 1.7 cm  Remaining nodules are primarily isoechoic and anechoic in nature and subcentimeter in size.  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    Latest Reference Range & Units 11/20/20 19:00 08/04/21 15:23  TSH 0.450 - 4.500 uIU/mL 0.833 0.430 (L)  Triiodothyronine,Free,Serum 2.0 - 4.4 pg/mL 3.3   T4,Free(Direct) 0.82 - 1.77 ng/dL 9.16 3.84  Thyroperoxidase Ab SerPl-aCnc 0 - 34 IU/mL <8   Thyroglobulin Antibody 0.0 - 0.9  IU/mL <1.0   (L): Data is abnormally low  Assessment & Plan:   1. Abnormal TSH  she is being seen at a kind request of Jonathon Bellows, DO.  Her follow up labs show marginally suppressed TSH of 0.43 and normal Free T4.   Her ultrasound showed multinodular thyroid with 1 nodule which is not suspicious for cancer (I suspect this is the nodule that was previously biopsied which came back as inadequate tissue sampling).  Will plan on rechecking thyroid ultrasound for surveillance after next visit.  Will recheck thyroid function tests prior to next visit in 1 year for surveillance.      -Patient is advised to maintain close follow up with Jonathon Bellows, DO for primary care needs.    I spent 20 minutes in the care of the patient today including review of labs from Thyroid Function, CMP, and other relevant labs ; imaging/biopsy records (current and previous including abstractions from other facilities); face-to-face time discussing  her lab results and symptoms, medications doses, her options of short and long term treatment based on the latest standards of care / guidelines;   and documenting the encounter.  Annette Dunn  participated in the discussions, expressed understanding, and voiced agreement with the above plans.  All questions were answered to her satisfaction. she is encouraged to contact clinic should she have any questions or concerns prior to her return visit.   Follow up plan: No follow-ups on file.   Thank you for involving me in the care of this pleasant patient, and I will continue to update you with her progress.  Ronny Bacon, [facility] [address] [place], [place] [address] Phone: [phone] Fax: [phone]  08/10/2021, 9:58 AM

## 2022-03-14 ENCOUNTER — Other Ambulatory Visit: Payer: Self-pay

## 2022-03-14 ENCOUNTER — Emergency Department (HOSPITAL_COMMUNITY)

## 2022-03-14 ENCOUNTER — Emergency Department (HOSPITAL_COMMUNITY)
Admission: EM | Admit: 2022-03-14 | Discharge: 2022-03-14 | Disposition: A | Discharge status: Home / Self Care | Attending: Emergency Medicine | Admitting: Emergency Medicine

## 2022-03-14 ENCOUNTER — Encounter (HOSPITAL_COMMUNITY): Payer: Self-pay | Admitting: Emergency Medicine

## 2022-03-14 DIAGNOSIS — Z20822 Contact with and (suspected) exposure to covid-19: Secondary | ICD-10-CM | POA: Diagnosis not present

## 2022-03-14 DIAGNOSIS — R059 Cough, unspecified: Secondary | ICD-10-CM | POA: Diagnosis present

## 2022-03-14 DIAGNOSIS — J069 Acute upper respiratory infection, unspecified: Secondary | ICD-10-CM | POA: Insufficient documentation

## 2022-03-14 LAB — CBC WITH DIFFERENTIAL/PLATELET
Abs Immature Granulocytes: 0.04 10*3/uL (ref 0.00–0.07)
Basophils Absolute: 0 10*3/uL (ref 0.0–0.1)
Basophils Relative: 1 %
Eosinophils Absolute: 0.2 10*3/uL (ref 0.0–0.5)
Eosinophils Relative: 3 %
HCT: 37.6 % (ref 36.0–46.0)
Hemoglobin: 12.8 g/dL (ref 12.0–15.0)
Immature Granulocytes: 1 %
Lymphocytes Relative: 29 %
Lymphs Abs: 2.1 10*3/uL (ref 0.7–4.0)
MCH: 28.9 pg (ref 26.0–34.0)
MCHC: 34 g/dL (ref 30.0–36.0)
MCV: 84.9 fL (ref 80.0–100.0)
Monocytes Absolute: 0.7 10*3/uL (ref 0.1–1.0)
Monocytes Relative: 10 %
Neutro Abs: 4.1 10*3/uL (ref 1.7–7.7)
Neutrophils Relative %: 56 %
Platelets: 228 10*3/uL (ref 150–400)
RBC: 4.43 MIL/uL (ref 3.87–5.11)
RDW: 12.1 % (ref 11.5–15.5)
WBC: 7.2 10*3/uL (ref 4.0–10.5)
nRBC: 0 % (ref 0.0–0.2)

## 2022-03-14 LAB — COMPREHENSIVE METABOLIC PANEL
ALT: 22 U/L (ref 0–44)
AST: 19 U/L (ref 15–41)
Albumin: 3.7 g/dL (ref 3.5–5.0)
Alkaline Phosphatase: 85 U/L (ref 38–126)
Anion gap: 7 (ref 5–15)
BUN: 13 mg/dL (ref 6–20)
CO2: 30 mmol/L (ref 22–32)
Calcium: 9.9 mg/dL (ref 8.9–10.3)
Chloride: 104 mmol/L (ref 98–111)
Creatinine, Ser: 0.8 mg/dL (ref 0.44–1.00)
GFR, Estimated: >60 mL/min (ref >60)
Glucose, Bld: 104 mg/dL — ABNORMAL HIGH (ref 70–99)
Potassium: 3.8 mmol/L (ref 3.5–5.1)
Sodium: 141 mmol/L (ref 135–145)
Total Bilirubin: 0.6 mg/dL (ref 0.3–1.2)
Total Protein: 7 g/dL (ref 6.5–8.1)

## 2022-03-14 LAB — SARS CORONAVIRUS 2 BY RT PCR: SARS Coronavirus 2 by RT PCR: NEGATIVE

## 2022-03-14 MED ORDER — DEXTROMETHORPHAN HBR 15 MG/5ML PO SYRP: 10.0000 mL | ORAL_SOLUTION | Freq: Every evening | ORAL | 0 refills | Status: DC | PRN

## 2022-03-14 MED ORDER — BENZONATATE 200 MG PO CAPS: 200.0000 mg | ORAL_CAPSULE | Freq: Three times a day (TID) | ORAL | 0 refills | Status: AC

## 2022-03-14 NOTE — ED Provider Notes (Signed)
[facility] EMERGENCY DEPARTMENT Provider Note   CSN: [id number] Arrival date & time: 03/14/22  1159     History  Chief Complaint  Patient presents with   Cough    Annette Dunn is a 56 y.o. female.  55 year old female presents with concern for cough.  Patient notes onset of symptoms on or around September 15 with sinus pressure and congestion. Patient went to UC on 9/20 and was started on antibiotics for sinus infection. Reports history of prior fungal polyp, requests referral to a local [facility] for follow up. Has been using her Flonase, Xyzal, alervert without relief/with persistent dry cough. 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 Has had negative COVID and flu tests.        Home Medications Prior to Admission medications   Medication Sig Start Date End Date Taking? Authorizing Provider  benzonatate (TESSALON) 200 MG capsule Take 1 capsule (200 mg total) by mouth every 8 (eight) hours for 10 days. 03/14/22 03/24/22 Yes Tacy Learn, PA-C  dextromethorphan 15 MG/5ML syrup Take 10 mLs (30 mg total) by mouth at bedtime and may repeat dose one time if needed. 03/14/22  Yes Tacy Learn, PA-C  ergocalciferol (VITAMIN D2) 1.25 MG (50000 UT) capsule Take by mouth. 07/18/19   [provider]  fexofenadine (ALLEGRA) 180 MG tablet Take by mouth as needed. 10/15/20   [provider]  fluticasone (FLONASE) 50 MCG/ACT nasal spray Place 2 sprays into both nostrils daily. 05/06/21   [provider]  gabapentin (NEURONTIN) 300 MG capsule Take by mouth as needed. 05/10/19   [provider]  hydrochlorothiazide (HYDRODIURIL) 25 MG tablet daily. 07/18/19   [provider]  metoprolol succinate (TOPROL-XL) 50 MG 24 hr tablet Take 25 mg by mouth daily. 10/15/20   [provider]  montelukast (SINGULAIR) 10 MG tablet daily as needed. 03/27/18   [provider]  Multiple Vitamin  (MULTIVITAMIN) tablet Take 1 tablet by mouth daily.    [provider]      Allergies    Doxycycline    Review of Systems   Review of Systems Negative except as per HPI Physical Exam Updated Vital Signs BP (!) 148/77 (BP Location: Right Arm)   Pulse 73   Temp 99.2 F (37.3 C) (Oral)   Resp 16   SpO2 97%  Physical Exam Vitals and nursing note reviewed.  Constitutional:      General: She is not in acute distress.    Appearance: She is well-developed. She is not diaphoretic.  HENT:     Head: Normocephalic and atraumatic.     Nose: Congestion present.     Mouth/Throat:     Mouth: Mucous membranes are moist.     Pharynx: No oropharyngeal exudate or posterior oropharyngeal erythema.     Comments: PND Eyes:     Conjunctiva/sclera: Conjunctivae normal.  Cardiovascular:     Rate and Rhythm: Normal rate and regular rhythm.     Heart sounds: Normal heart sounds.  Pulmonary:     Effort: Pulmonary effort is normal.     Breath sounds: Normal breath sounds.  Musculoskeletal:     Cervical back: Neck supple.  Lymphadenopathy:     Cervical: No cervical adenopathy.  Skin:    General: Skin is warm and dry.     Findings: No erythema or rash.  Neurological:     Mental Status: She is alert and oriented to person, place, and  time.  Psychiatric:        Behavior: Behavior normal.     ED Results / Procedures / Treatments   Labs (all labs ordered are listed, but only abnormal results are displayed) Labs Reviewed  COMPREHENSIVE METABOLIC PANEL - Abnormal; Notable for the following components:      Result Value   Glucose, Bld 104 (*)    All other components within normal limits  SARS CORONAVIRUS 2 BY RT PCR  CBC WITH DIFFERENTIAL/PLATELET    EKG None  Radiology DG Chest 2 View  Result Date: 03/14/2022 CLINICAL DATA:  Cough. EXAM: CHEST - 2 VIEW COMPARISON:  April 20, 2017 FINDINGS: Cardiomediastinal silhouette is normal. Mediastinal contours appear intact. There is  no evidence of focal airspace consolidation, pleural effusion or pneumothorax. Osseous structures are without acute abnormality. Soft tissues are grossly normal. IMPRESSION: No active cardiopulmonary disease. Electronically Signed   By: Ted Mcalpine M.D.   On: 03/14/2022 12:58    Procedures Procedures    Medications Ordered in ED Medications - No data to display  ED Course/ Medical Decision Making/ A&P                           Medical Decision Making Amount and/or Complexity of Data Reviewed Labs: ordered. Radiology: ordered.   56 year old female with complaint of dry cough x 1.5 weeks, not improving with antibiotics started 4 days ago. On exam, well appearing, BP elevated, taking Coricidin HBP for her symptoms without relief of her constant cough. Labs today including CBC and CMP are unremarkable, normal WBC and differential. CXR without acute disease, no pneumonia. COVID negative. 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 Discussed limited to on benefit with an antibiotic for this, cough likely to last several weeks. Patient with history of nasal polyp, requests sinus CT, not felt emergent at this time and will defer to ENT (referred by UC in [facility] to ENT in [place] but prefers to see ENT in [place]). Will try dextromethorphan for night time cough and Tessalon during the day.         Final Clinical Impression(s) / ED Diagnoses Final diagnoses:  Viral URI with cough    Rx / DC Orders ED Discharge Orders          Ordered    benzonatate (TESSALON) 200 MG capsule  Every 8 hours        03/14/22 1702    dextromethorphan 15 MG/5ML syrup  at bedtime and repeat x1 PRN        03/14/22 1702              Jeannie Fend, PA-C 03/14/22 1717    Loetta Rough, MD 03/16/22 [phone]

## 2022-03-14 NOTE — Discharge Instructions (Signed)
Nighttime cough elixir as needed as directed.  Can use Tessalon during the daytime. Drink plenty of clear fluids to help keep secretions thin. Referral provided for ENT locally.  Also recommend recheck with your primary care provider.

## 2022-03-14 NOTE — ED Triage Notes (Signed)
Patient here POV w/ a cough since Friday intermittent dry and coughing up [place] phlegm. Patient currently being treated with an antibiotic for sinus infection but the cough has persisted. Patient with hx of fungal polyp and is wondering if she has another recurrence of one since she can not stop coughing.

## 2022-05-22 LAB — BASIC METABOLIC PANEL
BUN: 15 (ref 4–21)
CO2: 29 — AB (ref 13–22)
Chloride: 108 (ref 99–108)
Creatinine: 0.8 (ref 0.5–1.1)
Glucose: 109
Potassium: 4.3 mEq/L (ref 3.5–5.1)
Sodium: 143 (ref 137–147)

## 2022-05-22 LAB — HEPATIC FUNCTION PANEL
ALT: 14 U/L (ref 7–35)
AST: 14 (ref 13–35)
Bilirubin, Total: 0.4

## 2022-05-22 LAB — COMPREHENSIVE METABOLIC PANEL
Albumin: 3.9 (ref 3.5–5.0)
Calcium: 11 — AB (ref 8.7–10.7)

## 2022-05-22 LAB — VITAMIN D 25 HYDROXY (VIT D DEFICIENCY, FRACTURES): Vit D, 25-Hydroxy: 36

## 2022-05-22 LAB — TSH: TSH: 0.15 — AB (ref 0.41–5.90)

## 2022-05-22 LAB — HEMOGLOBIN A1C: Hemoglobin A1C: 5.9

## 2022-05-26 ENCOUNTER — Encounter: Payer: Self-pay | Admitting: Nurse Practitioner

## 2022-06-17 LAB — LAB REPORT - SCANNED: EGFR: 87

## 2022-07-21 ENCOUNTER — Other Ambulatory Visit: Payer: Self-pay

## 2022-07-21 ENCOUNTER — Other Ambulatory Visit: Payer: Self-pay | Admitting: Otolaryngology

## 2022-07-21 ENCOUNTER — Encounter (HOSPITAL_BASED_OUTPATIENT_CLINIC_OR_DEPARTMENT_OTHER): Payer: Self-pay | Admitting: Otolaryngology

## 2022-07-23 ENCOUNTER — Encounter (HOSPITAL_BASED_OUTPATIENT_CLINIC_OR_DEPARTMENT_OTHER)
Admission: RE | Admit: 2022-07-23 | Discharge: 2022-07-23 | Disposition: A | Discharge status: Home / Self Care | Source: Ambulatory Visit | Attending: Otolaryngology | Admitting: Otolaryngology

## 2022-07-23 DIAGNOSIS — Z01818 Encounter for other preprocedural examination: Secondary | ICD-10-CM | POA: Insufficient documentation

## 2022-07-23 DIAGNOSIS — I10 Essential (primary) hypertension: Secondary | ICD-10-CM | POA: Diagnosis not present

## 2022-07-23 DIAGNOSIS — Z79899 Other long term (current) drug therapy: Secondary | ICD-10-CM | POA: Diagnosis not present

## 2022-07-23 LAB — BASIC METABOLIC PANEL
Anion gap: 7 (ref 5–15)
BUN: 13 mg/dL (ref 6–20)
CO2: 29 mmol/L (ref 22–32)
Calcium: 9.3 mg/dL (ref 8.9–10.3)
Chloride: 105 mmol/L (ref 98–111)
Creatinine, Ser: 0.77 mg/dL (ref 0.44–1.00)
GFR, Estimated: >60 mL/min (ref >60)
Glucose, Bld: 98 mg/dL (ref 70–99)
Potassium: 4 mmol/L (ref 3.5–5.1)
Sodium: 141 mmol/L (ref 135–145)

## 2022-07-23 NOTE — Progress Notes (Signed)

## 2022-07-28 ENCOUNTER — Ambulatory Visit (HOSPITAL_BASED_OUTPATIENT_CLINIC_OR_DEPARTMENT_OTHER)
Admission: RE | Admit: 2022-07-28 | Discharge: 2022-07-28 | Disposition: A | Discharge status: Home / Self Care | Source: Ambulatory Visit | Attending: Otolaryngology | Admitting: Otolaryngology

## 2022-07-28 ENCOUNTER — Encounter (HOSPITAL_BASED_OUTPATIENT_CLINIC_OR_DEPARTMENT_OTHER)
Admission: RE | Disposition: A | Discharge status: Home / Self Care | Payer: Self-pay | Source: Ambulatory Visit | Attending: Otolaryngology

## 2022-07-28 ENCOUNTER — Other Ambulatory Visit: Payer: Self-pay

## 2022-07-28 ENCOUNTER — Encounter (HOSPITAL_BASED_OUTPATIENT_CLINIC_OR_DEPARTMENT_OTHER): Payer: Self-pay | Admitting: Otolaryngology

## 2022-07-28 ENCOUNTER — Ambulatory Visit (HOSPITAL_BASED_OUTPATIENT_CLINIC_OR_DEPARTMENT_OTHER): Admitting: Anesthesiology

## 2022-07-28 DIAGNOSIS — I10 Essential (primary) hypertension: Secondary | ICD-10-CM | POA: Insufficient documentation

## 2022-07-28 DIAGNOSIS — J3489 Other specified disorders of nose and nasal sinuses: Secondary | ICD-10-CM | POA: Diagnosis not present

## 2022-07-28 DIAGNOSIS — J342 Deviated nasal septum: Secondary | ICD-10-CM | POA: Insufficient documentation

## 2022-07-28 DIAGNOSIS — Z7722 Contact with and (suspected) exposure to environmental tobacco smoke (acute) (chronic): Secondary | ICD-10-CM | POA: Diagnosis not present

## 2022-07-28 DIAGNOSIS — J320 Chronic maxillary sinusitis: Secondary | ICD-10-CM | POA: Insufficient documentation

## 2022-07-28 DIAGNOSIS — Z01818 Encounter for other preprocedural examination: Secondary | ICD-10-CM

## 2022-07-28 DIAGNOSIS — Z79899 Other long term (current) drug therapy: Secondary | ICD-10-CM

## 2022-07-28 HISTORY — PX: NASAL SEPTOPLASTY W/ TURBINOPLASTY: SHX2070

## 2022-07-28 HISTORY — DX: Other specified postprocedural states: Z98.890

## 2022-07-28 SURGERY — SEPTOPLASTY, NOSE, WITH NASAL TURBINATE REDUCTION
Anesthesia: General | Site: Nose | Laterality: Bilateral

## 2022-07-28 MED ORDER — HYDROCODONE-ACETAMINOPHEN 5-325 MG PO TABS: 1.0000 | ORAL_TABLET | ORAL | 0 refills | Status: AC | PRN

## 2022-07-28 MED ORDER — CEFAZOLIN SODIUM-DEXTROSE 2-4 GM/100ML-% IV SOLN
INTRAVENOUS | Status: AC
  Filled 2022-07-28: qty 100

## 2022-07-28 MED ORDER — OXYMETAZOLINE HCL 0.05 % NA SOLN
NASAL | Status: AC
  Filled 2022-07-28: qty 30

## 2022-07-28 MED ORDER — ROCURONIUM BROMIDE 100 MG/10ML IV SOLN
INTRAVENOUS | Status: DC | PRN
  Administered 2022-07-28: 60 mg via INTRAVENOUS

## 2022-07-28 MED ORDER — LIDOCAINE 2% (20 MG/ML) 5 ML SYRINGE
INTRAMUSCULAR | Status: AC
  Filled 2022-07-28: qty 5

## 2022-07-28 MED ORDER — ACETAMINOPHEN 500 MG PO TABS
1000.0000 mg | ORAL_TABLET | Freq: Once | ORAL | Status: AC
  Administered 2022-07-28: 1000 mg via ORAL

## 2022-07-28 MED ORDER — DEXAMETHASONE SODIUM PHOSPHATE 10 MG/ML IJ SOLN
INTRAMUSCULAR | Status: AC
  Filled 2022-07-28: qty 1

## 2022-07-28 MED ORDER — ONDANSETRON HCL 4 MG/2ML IJ SOLN
INTRAMUSCULAR | Status: AC
  Filled 2022-07-28: qty 2

## 2022-07-28 MED ORDER — SUGAMMADEX SODIUM 200 MG/2ML IV SOLN
INTRAVENOUS | Status: DC | PRN
  Administered 2022-07-28: 200 mg via INTRAVENOUS

## 2022-07-28 MED ORDER — LIDOCAINE HCL (CARDIAC) PF 100 MG/5ML IV SOSY
PREFILLED_SYRINGE | INTRAVENOUS | Status: DC | PRN
  Administered 2022-07-28: 100 mg via INTRAVENOUS

## 2022-07-28 MED ORDER — LIDOCAINE-EPINEPHRINE 1 %-1:100000 IJ SOLN
INTRAMUSCULAR | Status: AC
  Filled 2022-07-28: qty 1

## 2022-07-28 MED ORDER — PROPOFOL 10 MG/ML IV BOLUS
INTRAVENOUS | Status: AC
  Filled 2022-07-28: qty 20

## 2022-07-28 MED ORDER — EPHEDRINE SULFATE (PRESSORS) 50 MG/ML IJ SOLN
INTRAMUSCULAR | Status: DC | PRN
  Administered 2022-07-28: 10 mg via INTRAVENOUS

## 2022-07-28 MED ORDER — LACTATED RINGERS IV SOLN: INTRAVENOUS | Status: DC

## 2022-07-28 MED ORDER — LIDOCAINE-EPINEPHRINE 1 %-1:100000 IJ SOLN
INTRAMUSCULAR | Status: DC | PRN
  Administered 2022-07-28: 8 mL

## 2022-07-28 MED ORDER — CEFAZOLIN SODIUM-DEXTROSE 2-3 GM-%(50ML) IV SOLR
INTRAVENOUS | Status: DC | PRN
  Administered 2022-07-28: 2 g via INTRAVENOUS

## 2022-07-28 MED ORDER — SCOPOLAMINE 1 MG/3DAYS TD PT72
MEDICATED_PATCH | TRANSDERMAL | Status: AC
  Filled 2022-07-28: qty 1

## 2022-07-28 MED ORDER — OXYCODONE HCL 5 MG/5ML PO SOLN: 5.0000 mg | Freq: Once | ORAL | Status: DC | PRN

## 2022-07-28 MED ORDER — FENTANYL CITRATE (PF) 100 MCG/2ML IJ SOLN
INTRAMUSCULAR | Status: AC
  Filled 2022-07-28: qty 2

## 2022-07-28 MED ORDER — BACITRACIN ZINC 500 UNIT/GM EX OINT
TOPICAL_OINTMENT | CUTANEOUS | Status: DC | PRN
  Administered 2022-07-28: 1 via TOPICAL

## 2022-07-28 MED ORDER — OXYCODONE HCL 5 MG PO TABS: 5.0000 mg | ORAL_TABLET | Freq: Once | ORAL | Status: DC | PRN

## 2022-07-28 MED ORDER — ACETAMINOPHEN 500 MG PO TABS
ORAL_TABLET | ORAL | Status: AC
  Filled 2022-07-28: qty 2

## 2022-07-28 MED ORDER — SODIUM CHLORIDE 0.9 % IV SOLN
INTRAVENOUS | Status: AC | PRN
  Administered 2022-07-28: 100 mL

## 2022-07-28 MED ORDER — EPINEPHRINE HCL (NASAL) 0.1 % NA SOLN
NASAL | Status: DC | PRN
  Administered 2022-07-28: 8 mL via NASAL

## 2022-07-28 MED ORDER — MIDAZOLAM HCL 2 MG/2ML IJ SOLN
INTRAMUSCULAR | Status: AC
  Filled 2022-07-28: qty 2

## 2022-07-28 MED ORDER — DEXAMETHASONE SODIUM PHOSPHATE 4 MG/ML IJ SOLN
INTRAMUSCULAR | Status: DC | PRN
  Administered 2022-07-28: 10 mg via INTRAVENOUS

## 2022-07-28 MED ORDER — SCOPOLAMINE 1 MG/3DAYS TD PT72
1.0000 | MEDICATED_PATCH | Freq: Once | TRANSDERMAL | Status: DC
  Administered 2022-07-28: 1.5 mg via TRANSDERMAL

## 2022-07-28 MED ORDER — PHENYLEPHRINE 80 MCG/ML (10ML) SYRINGE FOR IV PUSH (FOR BLOOD PRESSURE SUPPORT)
PREFILLED_SYRINGE | INTRAVENOUS | Status: AC
  Filled 2022-07-28: qty 10

## 2022-07-28 MED ORDER — BACITRACIN ZINC 500 UNIT/GM EX OINT
TOPICAL_OINTMENT | CUTANEOUS | Status: AC
  Filled 2022-07-28: qty 28.35

## 2022-07-28 MED ORDER — FENTANYL CITRATE (PF) 100 MCG/2ML IJ SOLN: 25.0000 ug | INTRAMUSCULAR | Status: DC | PRN

## 2022-07-28 MED ORDER — ROCURONIUM BROMIDE 10 MG/ML (PF) SYRINGE
PREFILLED_SYRINGE | INTRAVENOUS | Status: AC
  Filled 2022-07-28: qty 10

## 2022-07-28 MED ORDER — AMISULPRIDE (ANTIEMETIC) 5 MG/2ML IV SOLN: 10.0000 mg | Freq: Once | INTRAVENOUS | Status: DC | PRN

## 2022-07-28 MED ORDER — PROPOFOL 10 MG/ML IV BOLUS
INTRAVENOUS | Status: DC | PRN
  Administered 2022-07-28: 170 mg via INTRAVENOUS

## 2022-07-28 SURGICAL SUPPLY — 52 items
APL SRG 3 HI ABS STRL LF PLS (MISCELLANEOUS) ×1
APPLICATOR DR MATTHEWS STRL (MISCELLANEOUS) ×1 IMPLANT
BLADE INF TURB ROT M4 2 5PK (BLADE) IMPLANT
BLADE SURG 15 STRL LF DISP TIS (BLADE) IMPLANT
BLADE SURG 15 STRL SS (BLADE)
CANISTER SUCT 1200ML W/VALVE (MISCELLANEOUS) ×1 IMPLANT
COAGULATOR SUCT 8FR VV (MISCELLANEOUS) IMPLANT
COAGULATOR SUCT SWTCH 10FR 6 (ELECTROSURGICAL) IMPLANT
DRSG NASOPORE 8CM (GAUZE/BANDAGES/DRESSINGS) IMPLANT
DRSG TELFA 3X8 NADH STRL (GAUZE/BANDAGES/DRESSINGS) IMPLANT
ELECT NDL BLADE 2-5/6 (NEEDLE) IMPLANT
ELECT NEEDLE BLADE 2-5/6 (NEEDLE) ×1 IMPLANT
ELECT REM PT RETURN 9FT ADLT (ELECTROSURGICAL) ×1
ELECTRODE REM PT RTRN 9FT ADLT (ELECTROSURGICAL) ×1 IMPLANT
GLOVE BIO SURGEON STRL SZ7 (GLOVE) IMPLANT
GLOVE BIO SURGEON STRL SZ7.5 (GLOVE) ×1 IMPLANT
GLOVE BIOGEL PI IND STRL 8 (GLOVE) ×1 IMPLANT
GOWN STRL REUS W/ TWL LRG LVL3 (GOWN DISPOSABLE) ×1 IMPLANT
GOWN STRL REUS W/ TWL XL LVL3 (GOWN DISPOSABLE) ×1 IMPLANT
GOWN STRL REUS W/TWL LRG LVL3 (GOWN DISPOSABLE) ×1
GOWN STRL REUS W/TWL XL LVL3 (GOWN DISPOSABLE) ×1
IV SET EXT 30 76VOL 4 MALE LL (IV SETS) ×1 IMPLANT
MANIFOLD NEPTUNE II (INSTRUMENTS) ×1 IMPLANT
NDL FILTER BLUNT 18X1 1/2 (NEEDLE) ×1 IMPLANT
NDL HYPO 25GX1X1/2 BEV (NEEDLE) IMPLANT
NDL HYPO 27GX1-1/4 (NEEDLE) ×1 IMPLANT
NEEDLE FILTER BLUNT 18X1 1/2 (NEEDLE) ×1 IMPLANT
NEEDLE HYPO 25GX1X1/2 BEV (NEEDLE) IMPLANT
NEEDLE HYPO 27GX1-1/4 (NEEDLE) ×1 IMPLANT
NS IRRIG 1000ML POUR BTL (IV SOLUTION) IMPLANT
PACK BASIN DAY SURGERY FS (CUSTOM PROCEDURE TRAY) ×1 IMPLANT
PACK ENT DAY SURGERY (CUSTOM PROCEDURE TRAY) ×1 IMPLANT
PATTIES SURGICAL .5 X3 (DISPOSABLE) ×1 IMPLANT
PENCIL SMOKE EVACUATOR (MISCELLANEOUS) IMPLANT
SHEATH ENDOSCRUB 0 DEG (SHEATH) IMPLANT
SLEEVE SCD COMPRESS KNEE MED (STOCKING) IMPLANT
SOL ANTI FOG 6CC (MISCELLANEOUS) ×1 IMPLANT
SPIKE FLUID TRANSFER (MISCELLANEOUS) IMPLANT
SPLINT NASAL AIRWAY SILICONE (MISCELLANEOUS) ×1 IMPLANT
SPONGE GAUZE 2X2 8PLY STRL LF (GAUZE/BANDAGES/DRESSINGS) ×1 IMPLANT
SPONGE NEURO XRAY DETECT 1X3 (DISPOSABLE) IMPLANT
SPONGE SURGIFOAM ABS GEL 12-7 (HEMOSTASIS) IMPLANT
SUT CHROMIC 4 0 PS 2 18 (SUTURE) ×1 IMPLANT
SUT PLAIN 4 0 ~~LOC~~ 1 (SUTURE) IMPLANT
SUT PLAIN GUT FAST 5-0 (SUTURE) ×1 IMPLANT
SUT SILK 3 0 PS 1 (SUTURE) ×1 IMPLANT
SYR 10ML LL (SYRINGE) ×1 IMPLANT
TOWEL GREEN STERILE FF (TOWEL DISPOSABLE) ×1 IMPLANT
TUBE CONNECTING 20X1/4 (TUBING) IMPLANT
TUBE SALEM SUMP 12F (TUBING) IMPLANT
TUBE SALEM SUMP 16F (TUBING) IMPLANT
YANKAUER SUCT BULB TIP NO VENT (SUCTIONS) ×1 IMPLANT

## 2022-07-28 NOTE — Discharge Instructions (Addendum)
DISCHARGE INSTRUCTIONS FOLLOWING YOUR SEPTOPLASTY and inferior turbinate reductions  1.  Bloody nasal discharge for several days is common in the post operative period. Please use over the counter saline sprays every 3 hours while you are awake to lubricate your nasal tissues. Please use over the counter antibiotic ointment twice daily until your first post-operative visit.  2.  Use nasal slings to absorb nasal drainage 3.  Swelling and bruising should be minimal 4.  Your nose will be more congested because of blood and mucus until the splints are removed 5.  Take it easy, as reduced energy for several days following surgery is normal 6.  You should be able to return to work in 5 to 7 days 7.  You will have an appointment in 5-7 days following surgery for removal of your splints 8.  Your nose is "fragile" for 6 weeks, avoid trauma to the nose during this period.  MEDICATIONS 1. Resume all home medications as directed. 2. Start new discharge medications as directed. 3. Do not drive or operate machinery while taking narcotic pain medications.   DIET 1. Resume same diet prior to your hospital admission.   RETURN 1. Follow-up in the ENT clinic as scheduled 2. Call [phone] to confirm or schedule your appointment.  3. Call [phone] or go to the Emergency Department near you for any symptoms such as fevers with temperature greater than 101.5 F, chills, persistent nausea and vomiting, severe pain not controlled with medications, purulent or foul-smelling drainage from the wound site, or for any acute problems or il  Post Anesthesia Home Care Instructions  Activity: Get plenty of rest for the remainder of the day. A responsible individual must stay with you for 24 hours following the procedure.  For the next 24 hours, DO NOT: -Drive a car -Paediatric nurse -Drink alcoholic beverages -Take any medication unless instructed by your physician -Make any legal decisions or sign  important papers.  Meals: Start with liquid foods such as gelatin or soup. Progress to regular foods as tolerated. Avoid greasy, spicy, heavy foods. If nausea and/or vomiting occur, drink only clear liquids until the nausea and/or vomiting subsides. Call your physician if vomiting continues.  Special Instructions/Symptoms: Your throat may feel dry or sore from the anesthesia or the breathing tube placed in your throat during surgery. If this causes discomfort, gargle with warm salt water. The discomfort should disappear within 24 hours.  If you had a scopolamine patch placed behind your ear for the management of post- operative nausea and/or vomiting:  1. The medication in the patch is effective for 72 hours, after which it should be removed.  Wrap patch in a tissue and discard in the trash. Wash hands thoroughly with soap and water. 2. You may remove the patch earlier than 72 hours if you experience unpleasant side effects which may include dry mouth, dizziness or visual disturbances. 3. Avoid touching the patch. Wash your hands with soap and water after contact with the patch.     No Tylenol until after 4:00pm today.

## 2022-07-28 NOTE — H&P (Signed)
Annette Dunn is an 57 y.o. female.    Chief Complaint:  Nasal congestion  HPI: Patient presents today for planned elective procedure.  He/she denies any interval change in history since office visit on 05/12/22.  Past Medical History:  Diagnosis Date   Hypertension    PONV (postoperative nausea and vomiting)     Past Surgical History:  Procedure Laterality Date   ABDOMINAL HYSTERECTOMY      Family History  Problem Relation Age of Onset   Thyroid disease Mother    Hypertension Mother    Thyroid disease Father    Hypertension Father     Social History:  reports that she has never smoked. She has been exposed to tobacco smoke. She has never used smokeless tobacco. She reports that she does not drink alcohol and does not use drugs.  Allergies:  Allergies  Allergen Reactions   Doxycycline Rash    Other reaction(s): rash/itching    Medications Prior to Admission  Medication Sig Dispense Refill   chlorthalidone (HYGROTON) 25 MG tablet Take 25 mg by mouth daily.     ergocalciferol (VITAMIN D2) 1.25 MG (50000 UT) capsule Take by mouth.     fexofenadine (ALLEGRA) 180 MG tablet Take by mouth as needed.     fluticasone (FLONASE) 50 MCG/ACT nasal spray Place 2 sprays into both nostrils daily.     gabapentin (NEURONTIN) 300 MG capsule Take by mouth as needed.     metoprolol succinate (TOPROL-XL) 50 MG 24 hr tablet Take 25 mg by mouth daily.     montelukast (SINGULAIR) 10 MG tablet daily as needed.     Multiple Vitamin (MULTIVITAMIN) tablet Take 1 tablet by mouth daily.     dextromethorphan 15 MG/5ML syrup Take 10 mLs (30 mg total) by mouth at bedtime and may repeat dose one time if needed. 120 mL 0    No results found for this or any previous visit (from the past 48 hour(s)). No results found.  ROS: negative other than stated in HPI  Blood pressure (!) 157/79, pulse 63, temperature 98.2 F (36.8 C), temperature source Oral, resp. rate 16, height 5\' 4"  (1.626 m), weight 87.5  kg, SpO2 100 %.  PHYSICAL EXAM: General: Resting comfortably in NAD  Lungs: Non-labored respiratinos  Studies Reviewed: none   Assessment/Plan Deviated nasal septum Inferior turbinate hypertrophy  Proceed with septoplasty and inferior turbinate reduction. Informed consent obtained. R/B/A discussed in detail including R/B/A/ discussed. Risks discussed in detail including pain, bleeding, infection, nasal deformity, failure to relieve symptoms, nasal septal perforation, nasal synechiae, need for further surgery, risks of anesthesia. Despite these risks the patient requested to proceed.     Electronically signed by:  Jenetta Downer, MD  Staff Physician [facility] - Head and Neck [facility] [place]  07/28/2022, 8:52 AM

## 2022-07-28 NOTE — Anesthesia Preprocedure Evaluation (Addendum)
Anesthesia Evaluation  Patient identified by MRN, date of birth, ID band Patient awake    Reviewed: Allergy & Precautions, NPO status , Patient's Chart, lab work & pertinent test results, reviewed documented beta blocker date and time   History of Anesthesia Complications (+) PONV and history of anesthetic complications  Airway Mallampati: II  TM Distance: >3 FB Neck ROM: Full    Dental no notable dental hx.    Pulmonary neg pulmonary ROS   Pulmonary exam normal        Cardiovascular hypertension, Pt. on medications and Pt. on home beta blockers Normal cardiovascular exam     Neuro/Psych negative neurological ROS  negative psych ROS   GI/Hepatic negative GI ROS, Neg liver ROS,,,  Endo/Other  negative endocrine ROS    Renal/GU negative Renal ROS  negative genitourinary   Musculoskeletal negative musculoskeletal ROS (+)    Abdominal   Peds  Hematology negative hematology ROS (+)   Anesthesia Other Findings Chronic maxillary sinusitis; Deviated nasal septum; History of sinus surgery  Reproductive/Obstetrics negative OB ROS                             Anesthesia Physical Anesthesia Plan  ASA: 2  Anesthesia Plan: General   Post-op Pain Management: Tylenol PO (pre-op)*   Induction: Intravenous  PONV Risk Score and Plan: Treatment may vary due to age or medical condition, Ondansetron, Dexamethasone, Midazolam, Propofol infusion and Scopolamine patch - Pre-op  Airway Management Planned: Oral ETT  Additional Equipment: None  Intra-op Plan:   Post-operative Plan: Extubation in OR  Informed Consent: I have reviewed the patients History and Physical, chart, labs and discussed the procedure including the risks, benefits and alternatives for the proposed anesthesia with the patient or authorized representative who has indicated his/her understanding and acceptance.     Dental advisory  given  Plan Discussed with: CRNA  Anesthesia Plan Comments:        Anesthesia Quick Evaluation

## 2022-07-28 NOTE — Transfer of Care (Signed)
Immediate Anesthesia Transfer of Care Note  Patient: Annette Dunn  Procedure(s) Performed: NASAL SEPTOPLASTY WITH TURBINATE REDUCTION (Bilateral)  Patient Location: PACU  Anesthesia Type:General  Level of Consciousness: sedated  Airway & Oxygen Therapy: Patient Spontanous Breathing and Patient connected to face mask oxygen  Post-op Assessment: Report given to RN and Post -op Vital signs reviewed and stable  Post vital signs: Reviewed and stable  Last Vitals:  Vitals Value Taken Time  BP 170/68 07/28/22 1039  Temp    Pulse 95 07/28/22 1040  Resp 17 07/28/22 1040  SpO2 100 % 07/28/22 1040  Vitals shown include unvalidated device data.  Last Pain:  Vitals:   07/28/22 0754  TempSrc: Oral  PainSc: 0-No pain      Patients Stated Pain Goal: 3 (25/42/70 6237)  Complications: No notable events documented.

## 2022-07-28 NOTE — Anesthesia Postprocedure Evaluation (Signed)
Anesthesia Post Note  Patient: Annette Dunn  Procedure(s) Performed: NASAL SEPTOPLASTY WITH TURBINATE REDUCTION (Bilateral: Nose)     Patient location during evaluation: PACU Anesthesia Type: General Level of consciousness: awake and alert Pain management: pain level controlled Vital Signs Assessment: post-procedure vital signs reviewed and stable Respiratory status: spontaneous breathing, nonlabored ventilation and respiratory function stable Cardiovascular status: blood pressure returned to baseline Postop Assessment: no apparent nausea or vomiting Anesthetic complications: no   No notable events documented.  Last Vitals:  Vitals:   07/28/22 1045 07/28/22 1100  BP: (!) 188/77 (!) 153/76  Pulse: (!) 109 97  Resp: 18 15  Temp:    SpO2: 100% 98%    Last Pain:  Vitals:   07/28/22 1100  TempSrc:   PainSc: 0-No pain                 Marthenia Rolling

## 2022-07-28 NOTE — Anesthesia Procedure Notes (Signed)
Procedure Name: Intubation Date/Time: 07/28/2022 9:26 AM  Performed by: Maryella Shivers, CRNAPre-anesthesia Checklist: Patient identified, Emergency Drugs available, Suction available and Patient being monitored Patient Re-evaluated:Patient Re-evaluated prior to induction Oxygen Delivery Method: Circle system utilized Preoxygenation: Pre-oxygenation with 100% oxygen Induction Type: IV induction Ventilation: Mask ventilation without difficulty Laryngoscope Size: Mac and 3 Grade View: Grade II Tube type: Oral Tube size: 7.0 mm Number of attempts: 1 Airway Equipment and Method: Stylet and Oral airway Placement Confirmation: ETT inserted through vocal cords under direct vision, positive ETCO2 and breath sounds checked- equal and bilateral Secured at: 20 cm Tube secured with: Tape Dental Injury: Teeth and Oropharynx as per pre-operative assessment

## 2022-07-28 NOTE — Op Note (Signed)
[facility] OPERATIVE NOTE  Annette Dunn Date/Time of Admission: 07/28/2022  7:33 AM  CSN: [id number]:[id number] Attending Provider: Jenetta Downer, MD Room/Bed: [facility]/NONE DOB: 21-Jan-1966 Age: 57 y.o.   Pre-Op Diagnosis: Chronic maxillary sinusitis; Deviated nasal septum; History of sinus surgery  Post-Op Diagnosis: * No post-op diagnosis entered *  Procedure: Endoscopic septoplasty (CPT 30520) Bilateral inferior turbinate submucous reduction and outfracture (CPT 30140)  Anesthesia: General  Surgeon(s): Pamala Hurry, MD  Staff: Circulator: Eston Esters, RN Relief Circulator: Eda Paschal, RN Scrub Person: Florestine Avers Circulator Assistant: McDonough-Hughes, Delene Ruffini, RN  Implants: * No implants in log *  Specimens: * No specimens in log *  Complications: none  EBL: 10 ML  IVF: Per anesthesia   Condition: stable  Indications for Procedure: Annette Dunn is a 57 year old Female with a history of prior endoscopic sinus surgery by an outside provider with chief complaint of nasal obstruction. Nasal examination demonstrated a right sided nasal septal deviation and septal spur with patent sinus antrostomies. She requested definitive surgical management.  Informed consent was obtained. R/B/A discussed including risks of risks discussed in detail including pain, bleeding, infection, nasal deformity, failure to relieve symptoms, nasal septal perforation, nasal synechiae, need for further surgery, risks of anesthesia. Despite these risks the patient requested to proceed.    Operative Findings:  Right sided deviated nasal septum and septal spur 3+ ITH Patent maxillary antrostomies and anterior ethmoidectomies  Description of Operation:  The patient was identified in the pre-op area and consent confirmed in the chart. The patient was then brought to the operative suite, placed in a supine position, and general anesthesia induced. We then  performed an operative time out to confirm proper patient and surgery to be performed. After all were in agreement, we began with surgery.  The head of the bed was then turned 90 degrees. 1:1000 epinephrine-soaked pledgets were placed in both nasal cavities. The patient was then prepped and draped in a standard fashion for septoplasty.  After insertion of a nasal speculum, the septum was injected with 1% lidocaine with epinephrine. A hemitransfixion incision was performed along the left side and a subperichondrial flap was elevated. The flap was then elevated on the opposite side and the deviated cartilage was removed leaving a 1.5 centimeter dorsal and anterior/inferior strut. Deviated septal spur was removed with 45mm osteotome.  We proceeded with the bilateral inferior turbinate reduction. Starting on the left side, a 15 blade scalpel was used to make an incision in the axilla of the head of the inferior turbinate. A cottle was used to raise a medial submucoperiosteal plane. The 2.55mm turbinate microdebrider was used to perform submucous resection. The turbinate was outfractured with the boise elevator and hemostasis achieved with bovie and epi pledgets. The right side was treated identically.   A 4-0 chromic running quilting suture was used to oppose both perichondrial flaps and the hemitransfixion incision was closed with interrupted  5-0 plain gut sutures. Doyle splints were placed bilaterally followed by a drip pad. The patient was extubated in the operating room and taken to the recovery area in stable condition. The patient tolerated the procedure without difficulty. She will be discharged with post-operative analgesics and has follow up in 1 week for doyle splint removal. Post-op instructions were provided to the patient's family.   Pamala Hurry, MD [facility]  07/28/2022

## 2022-07-29 ENCOUNTER — Encounter (HOSPITAL_BASED_OUTPATIENT_CLINIC_OR_DEPARTMENT_OTHER): Payer: Self-pay | Admitting: Otolaryngology

## 2022-08-06 ENCOUNTER — Telehealth: Payer: Self-pay | Admitting: Nurse Practitioner

## 2022-08-06 DIAGNOSIS — E0590 Thyrotoxicosis, unspecified without thyrotoxic crisis or storm: Secondary | ICD-10-CM

## 2022-08-06 NOTE — Telephone Encounter (Signed)
Pt needs labs updated

## 2022-08-06 NOTE — Telephone Encounter (Signed)
Lab orders updated and sent to [place].

## 2022-08-09 ENCOUNTER — Ambulatory Visit: Admitting: Nurse Practitioner

## 2022-08-09 DIAGNOSIS — E0590 Thyrotoxicosis, unspecified without thyrotoxic crisis or storm: Secondary | ICD-10-CM

## 2022-08-10 ENCOUNTER — Ambulatory Visit: Admitting: Nurse Practitioner

## 2022-08-10 LAB — TSH: TSH: 0.196 u[IU]/mL — ABNORMAL LOW (ref 0.450–4.500)

## 2022-08-10 LAB — T3, FREE: T3, Free: 3.3 pg/mL (ref 2.0–4.4)

## 2022-08-10 LAB — T4, FREE: Free T4: 1.15 ng/dL (ref 0.82–1.77)

## 2022-08-17 ENCOUNTER — Ambulatory Visit: Admitting: Nurse Practitioner

## 2022-08-17 ENCOUNTER — Encounter: Payer: Self-pay | Admitting: Nurse Practitioner

## 2022-08-17 VITALS — BP 140/78 | HR 57 | Ht 64.0 in | Wt 193.8 lb

## 2022-08-17 DIAGNOSIS — R7989 Other specified abnormal findings of blood chemistry: Secondary | ICD-10-CM | POA: Diagnosis not present

## 2022-08-17 DIAGNOSIS — E0590 Thyrotoxicosis, unspecified without thyrotoxic crisis or storm: Secondary | ICD-10-CM

## 2022-08-17 NOTE — Progress Notes (Signed)
08/17/2022     Endocrinology Follow Up Note    Subjective:    Patient ID: Annette Dunn, female    DOB: Dec 14, 1965, PCP Sherrilee Gilles, DO.   Past Medical History:  Diagnosis Date   Hypertension    PONV (postoperative nausea and vomiting)     Past Surgical History:  Procedure Laterality Date   ABDOMINAL HYSTERECTOMY     NASAL SEPTOPLASTY W/ TURBINOPLASTY Bilateral 07/28/2022   Procedure: NASAL SEPTOPLASTY WITH TURBINATE REDUCTION;  Surgeon: Jenetta Downer, MD;  Location: [place];  Service: ENT;  Laterality: Bilateral;    Social History   Socioeconomic History   Marital status: Married    Spouse name: Not on file   Number of children: Not on file   Years of education: Not on file   Highest education level: Not on file  Occupational History   Not on file  Tobacco Use   Smoking status: Never    Passive exposure: Past   Smokeless tobacco: Never  Vaping Use   Vaping Use: Never used  Substance and Sexual Activity   Alcohol use: No   Drug use: Never   Sexual activity: Not on file  Other Topics Concern   Not on file  Social History Narrative   Not on file   Social Determinants of Health   Financial Resource Strain: Not on file  Food Insecurity: Not on file  Transportation Needs: Not on file  Physical Activity: Not on file  Stress: Not on file  Social Connections: Not on file    Family History  Problem Relation Age of Onset   Thyroid disease Mother    Hypertension Mother    Thyroid disease Father    Hypertension Father     Outpatient Encounter Medications as of 08/17/2022  Medication Sig   chlorthalidone (HYGROTON) 25 MG tablet Take 25 mg by mouth daily.   dextromethorphan 15 MG/5ML syrup Take 10 mLs (30 mg total) by mouth at bedtime and may repeat dose one time if needed.   ergocalciferol (VITAMIN D2) 1.25 MG (50000 UT) capsule Take by mouth.   fexofenadine (ALLEGRA) 180 MG tablet Take by mouth as needed.   fluticasone (FLONASE)  50 MCG/ACT nasal spray Place 2 sprays into both nostrils daily.   metoprolol succinate (TOPROL-XL) 50 MG 24 hr tablet Take 25 mg by mouth daily.   montelukast (SINGULAIR) 10 MG tablet daily as needed.   Multiple Vitamin (MULTIVITAMIN) tablet Take 1 tablet by mouth daily.   gabapentin (NEURONTIN) 300 MG capsule Take by mouth as needed. (Patient not taking: Reported on 08/17/2022)   No facility-administered encounter medications on file as of 08/17/2022.    ALLERGIES: Allergies  Allergen Reactions   Doxycycline Rash    Other reaction(s): rash/itching    VACCINATION STATUS: Immunization History  Administered Date(s) Administered   Moderna Sars-Covid-2 Vaccination 09/14/2019   PFIZER Comirnaty(Gray Top)Covid-19 Tri-Sucrose Vaccine 08/24/2019, 09/14/2019   PFIZER(Purple Top)SARS-COV-2 Vaccination 08/24/2019, 05/18/2020     HPI  Annette Dunn is 57 y.o. female who presents today with a medical history as above. she is being seen in follow up after being seen in consultation for hyperthyroidism requested by Sherrilee Gilles, DO.  she has been dealing with symptoms of intermittent palpitations, vertigo, anxiety on and off since 2004 when she had her first incidence of suppressed TSH.  her most recent thyroid labs revealed suppressed TSH of 0.22 on 10/12/20.  She has moved all over the country due to her  husband being in the [employer].  She reports seeing several different specialists and undergoing several different tests for her thyroid.  She says she has had multiple uptake and scans in the past which did not show hyperthyroidism and she has had thyroid ultrasound showing suspicious nodule which needed FNA which came back inconclusive due to inadequate tissue sampling.     she does have family history of thyroid dysfunction in her dad and daughter (both hyperthyroidism needing RAI ablation), but denies family hx of thyroid cancer. she denies personal history of goiter. she is not on any anti-thyroid  medications nor on any thyroid hormone supplements. Denies use of Biotin containing supplements.  she is willing to proceed with appropriate work up and therapy for thyrotoxicosis.   Review of systems  Constitutional: + Minimally fluctuating body weight,  current Body mass index is 33.27 kg/m. , no fatigue, no subjective hyperthermia, no subjective hypothermia Eyes: no blurry vision, no xerophthalmia ENT: no sore throat, no nodules palpated in throat, + intermittent dysphagia/odynophagia, mild intermittent hoarseness Cardiovascular: no chest pain, no shortness of breath, no palpitations, no leg swelling Respiratory: no cough, no shortness of breath Gastrointestinal: no nausea/vomiting/diarrhea Musculoskeletal: no muscle/joint aches Skin: no rashes, no hyperemia Neurological: no tremors, no numbness, no tingling, no dizziness Psychiatric: no depression, no anxiety   Objective:    BP (!) 140/78 (BP Location: Right Arm, Patient Position: Sitting, Cuff Size: Normal) Comment: Retake manuel cuff  Pulse (!) 57   Ht '5\' 4"'$  (1.626 m)   Wt 193 lb 12.8 oz (87.9 kg)   BMI 33.27 kg/m   Wt Readings from Last 3 Encounters:  08/17/22 193 lb 12.8 oz (87.9 kg)  07/28/22 192 lb 14.4 oz (87.5 kg)  08/10/21 210 lb (95.3 kg)     BP Readings from Last 3 Encounters:  08/17/22 (!) 140/78  07/28/22 (!) 155/73  03/14/22 (!) 148/77                        Physical Exam- Limited  Constitutional:  Body mass index is 33.27 kg/m. , not in acute distress, normal state of mind Eyes:  EOMI, no exophthalmos Neck: Supple, mild fullness to neck noted Musculoskeletal: no gross deformities, strength intact in all four extremities, no gross restriction of joint movements Skin:  no rashes, no hyperemia Neurological: no tremor with outstretched hands   CMP     Component Value Date/Time   NA 141 07/23/2022 1330   NA 143 05/22/2022 0000   K 4.0 07/23/2022 1330   CL 105 07/23/2022 1330   CO2 29 07/23/2022  1330   GLUCOSE 98 07/23/2022 1330   BUN 13 07/23/2022 1330   BUN 15 05/22/2022 0000   CREATININE 0.77 07/23/2022 1330   CALCIUM 9.3 07/23/2022 1330   PROT 7.0 03/14/2022 1215   ALBUMIN 3.9 05/22/2022 0000   AST 14 05/22/2022 0000   ALT 14 05/22/2022 0000   ALKPHOS 85 03/14/2022 1215   BILITOT 0.6 03/14/2022 1215   GFRNONAA >60 07/23/2022 1330   GFRAA >60 04/20/2017 1317     CBC    Component Value Date/Time   WBC 7.2 03/14/2022 1215   RBC 4.43 03/14/2022 1215   HGB 12.8 03/14/2022 1215   HCT 37.6 03/14/2022 1215   PLT 228 03/14/2022 1215   MCV 84.9 03/14/2022 1215   MCH 28.9 03/14/2022 1215   MCHC 34.0 03/14/2022 1215   RDW 12.1 03/14/2022 1215   LYMPHSABS 2.1 03/14/2022 1215  MONOABS 0.7 03/14/2022 1215   EOSABS 0.2 03/14/2022 1215   BASOSABS 0.0 03/14/2022 1215     Diabetic Labs (most recent): Lab Results  Component Value Date   HGBA1C 5.9 05/22/2022    Lipid Panel  No results found for: "CHOL", "TRIG", "HDL", "CHOLHDL", "VLDL", "LDLCALC", "LDLDIRECT", "LABVLDL"   Lab Results  Component Value Date   TSH 0.196 (L) 08/09/2022   TSH 0.15 (A) 05/22/2022   TSH 0.430 (L) 08/04/2021   TSH 0.833 11/20/2020   FREET4 1.15 08/09/2022   FREET4 1.21 08/04/2021   FREET4 1.16 11/20/2020     Results for NIAH, SARNOWSKI (MRN [id number]) as of 02/09/2021 16:12  Ref. Range 11/20/2020 19:00  TSH Latest Ref Range: 0.450 - 4.500 uIU/mL 0.833  Triiodothyronine,Free,Serum Latest Ref Range: 2.0 - 4.4 pg/mL 3.3  T4,Free(Direct) Latest Ref Range: 0.82 - 1.77 ng/dL 1.16  Thyroperoxidase Ab SerPl-aCnc Latest Ref Range: 0 - 34 IU/mL <8  Thyroglobulin Antibody Latest Ref Range: 0.0 - 0.9 IU/mL <1.0   Thyroid US from 12/18/20  Reason for study: Suppressed TSH- mild goiter Comparison: none Findings: No adenopathy is seen in either jugular chain  Thyroid Echogenicity: Isoechoic  Right lobe: Size: 4.9 x 2.3 x 1.7 cm Nodules: Present  Left lobe: Size: 4.9 x 2.3 x 2.4  cm Nodules: Present  Isthmus: Size: 0.5 cm Nodules: Absent  Nodule 1: Gland: Left Location: Mid Composition: Mixed cystic and solid 1 pt Echogenicity: Anechoic 0 pts Shape: Wider than tall 0 pts Echogenic Foci: None or large comet tail artifact 0 pts Margin: Smooth well circumscribed 0 pts Other: Measuring up to 1.7 cm  Remaining nodules are primarily isoechoic and anechoic in nature and subcentimeter in size.  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    Latest Reference Range & Units 11/20/20 19:00 08/04/21 15:23 05/22/22 00:00 08/09/22 14:02  TSH 0.450 - 4.500 uIU/mL 0.833 0.430 (L) 0.15 ! (E) 0.196 (L)  Triiodothyronine,Free,Serum 2.0 - 4.4 pg/mL 3.3   3.3  T4,Free(Direct) 0.82 - 1.77 ng/dL 1.16 1.21  1.15  Thyroperoxidase Ab SerPl-aCnc 0 - 34 IU/mL <8     Thyroglobulin Antibody 0.0 - 0.9 IU/mL <1.0     (L): Data is abnormally low !: Data is abnormal (E): External lab result  Assessment & Plan:   1. Abnormal TSH 2. Subclinical hyperthyroidism 3. MNG  she is being seen at a kind request of Sherrilee Gilles, DO.  Her follow up labs show stable suppressed TSH and normal FT4 and FT3 values.  She does not need any antithyroid treatment at this time.  Will plan to recheck TFTs in 1 year for surveillance.  Her ultrasound showed multinodular thyroid with 1 nodule which is not suspicious for cancer (I suspect this is the nodule that was previously biopsied which came back as inadequate tissue sampling).  Will order thyroid ultrasound to reassess given her intermittent but increased frequency of difficulty swallowing.  I will call patient with the test results once they return.     -Patient is advised to maintain close follow up with Sherrilee Gilles, DO for primary care needs.    I spent  20  minutes in the care of the patient today including review of labs from Thyroid Function, CMP, and other relevant labs ; imaging/biopsy  records (current and previous including abstractions from other facilities); face-to-face time discussing  her lab results and symptoms, medications doses, her options of short and long term treatment based on the latest standards  of care / guidelines;   and documenting the encounter.  Annette Dunn  participated in the discussions, expressed understanding, and voiced agreement with the above plans.  All questions were answered to her satisfaction. she is encouraged to contact clinic should she have any questions or concerns prior to her return visit.   Follow up plan: Return in about 1 year (around 08/18/2023) for Thyroid follow up, Previsit labs, thyroid ultrasound (having done soon, will call her with results).   Thank you for involving me in the care of this pleasant patient, and I will continue to update you with her progress.  Rayetta Pigg, [facility] [address] [place], [place] [address] Phone: [phone] Fax: [phone]  08/17/2022, 10:12 AM

## 2022-09-28 ENCOUNTER — Encounter: Payer: Self-pay | Admitting: Internal Medicine

## 2022-09-28 ENCOUNTER — Ambulatory Visit: Attending: Internal Medicine | Admitting: Internal Medicine

## 2022-09-28 VITALS — BP 148/88 | HR 66 | Ht 64.0 in | Wt 199.2 lb

## 2022-09-28 DIAGNOSIS — R000 Tachycardia, unspecified: Secondary | ICD-10-CM | POA: Diagnosis not present

## 2022-09-28 MED ORDER — METOPROLOL SUCCINATE ER 25 MG PO TB24: 25.0000 mg | ORAL_TABLET | Freq: Two times a day (BID) | ORAL | 3 refills | Status: DC

## 2022-09-28 NOTE — Patient Instructions (Signed)
Medication Instructions:  INCREASE METOPROLOL TP 25 MG TWICE A DAY   *If you need a refill on your cardiac medications before your next appointment, please call your pharmacy*   Lab Work:  If you have labs (blood work) drawn today and your tests are completely normal, you will receive your results only by: MyChart Message (if you have MyChart) OR A paper copy in the mail If you have any lab test that is abnormal or we need to change your treatment, we will call you to review the results.   Testing/Procedures: Your physician has requested that you have an echocardiogram. Echocardiography is a painless test that uses sound waves to create images of your heart. It provides your doctor with information about the size and shape of your heart and how well your heart's chambers and valves are working. This procedure takes approximately one hour. There are no restrictions for this procedure. Please do NOT wear cologne, perfume, aftershave, or lotions (deodorant is allowed). Please arrive 15 minutes prior to your appointment time.   CALCIUM SCORE    Follow-Up: At [facility], you and your health needs are our priority.  As part of our continuing mission to provide you with exceptional heart care, we have created designated Provider Care Teams.  These Care Teams include your primary Cardiologist (physician) and Advanced Practice Providers (APPs -  Physician Assistants and Nurse Practitioners) who all work together to provide you with the care you need, when you need it.  We recommend signing up for the patient portal called "MyChart".  Sign up information is provided on this After Visit Summary.  MyChart is used to connect with patients for Virtual Visits (Telemedicine).  Patients are able to view lab/test results, encounter notes, upcoming appointments, etc.  Non-urgent messages can be sent to your provider as well.   To learn more about what you can do with MyChart, go to  [email or web address].    Your next appointment:   3 month(s)  Provider:   DR Dietrich Pates     Other Instructions

## 2022-09-28 NOTE — Progress Notes (Unsigned)
Cardiology Office Note   Date:  09/28/2022   ID:  Annette Dunn, DOB 1965-12-07, MRN [id number]  PCP:  Jonathon Bellows, DO  Cardiologist:   Dietrich Pates, MD   Pt presents for the evaluation of palpitations   History of Present Illness: Annette Dunn is a 57 y.o. female with a history of palpitations  Patient says she has had racing since 2017 Started on Toprol XL Episodes short lived, lasting a few seconds   Getting more frequent though   Gets a few times per week  Can be sitting and get  She cut back on caffeine   Noted no difference    Latest episode occurred when she was at PT   Neck was being manipulated and she sensed palpitations   Told therapist   Session stopped   She was sent to urgent care    Nothing found      Getting more frequent   Short lived    Cut back caffeine  When adds back noted no increase  The pt wore a Zio patch for 2 wks in Dec2023-Jan 2024.   This showed SR   AVerage HR 75 bpm     There were 2 bursts SVT, fastest for 4 beats (154 bpm), longest 20 beats at 127 bpm   Pt may have sensed one episode  BP at hme 137/80   AM   BP 140s, even 150  The pt denies CP   Breathing is OK    No dizziness    Pt denies numbness, weakness.  No neurologic complaints     Current Meds  Medication Sig   chlorthalidone (HYGROTON) 25 MG tablet Take 25 mg by mouth daily.   ergocalciferol (VITAMIN D2) 1.25 MG (50000 UT) capsule Take by mouth.   fexofenadine (ALLEGRA) 180 MG tablet Take by mouth as needed.   fluticasone (FLONASE) 50 MCG/ACT nasal spray Place 2 sprays into both nostrils daily.   meloxicam (MOBIC) 15 MG tablet Take 15 mg by mouth daily as needed.   metoprolol succinate (TOPROL-XL) 50 MG 24 hr tablet Take 25 mg by mouth daily.   montelukast (SINGULAIR) 10 MG tablet daily as needed.   Multiple Vitamin (MULTIVITAMIN) tablet Take 1 tablet by mouth daily.     Allergies:   Doxycycline   Past Medical History:  Diagnosis Date   Acute sinusitis    Allergic rhinitis     Back pain    BMI 35.0-35.9,adult    Cyst of nasal sinus    Deviated nasal septum    Drug-induced GI disturbance    Hidradenitis    Hypertension    Impaired fasting glucose    Muscle spasm of back    PONV (postoperative nausea and vomiting)    Sore throat    Strep pharyngitis    SVT (supraventricular tachycardia)    Vitamin D deficiency     Past Surgical History:  Procedure Laterality Date   ABDOMINAL HYSTERECTOMY     NASAL SEPTOPLASTY W/ TURBINOPLASTY Bilateral 07/28/2022   Procedure: NASAL SEPTOPLASTY WITH TURBINATE REDUCTION;  Surgeon: Scarlette Ar, MD;  Location: [facility];  Service: ENT;  Laterality: Bilateral;     Social History:  The patient  reports that she has never smoked. She has been exposed to tobacco smoke. She has never used smokeless tobacco. She reports that she does not drink alcohol and does not use drugs.   Family History:  The patient's family history includes Hypertension in her father and  mother; Thyroid disease in her father and mother.  Mother with CHF   Had CHF then DM  Died at 59 yo  ROS:  Please see the history of present illness. All other systems are reviewed and  Negative to the above problem except as noted.    PHYSICAL EXAM: VS:  BP (!) 148/88   Pulse 66   Ht 5\' 4"  (1.626 m)   Wt 199 lb 3.2 oz (90.4 kg)   SpO2 98%   BMI 34.19 kg/m   GEN: Well nourished, well developed, in no acute distress  HEENT: normal  Neck: no JVD, no carotid bruits Cardiac: RRR; no murmurs  No LE edema  Respiratory:  clear to auscultation bilaterally GI: soft, nontender, nondistended  No hepatomegaly   EKG:  EKG is ordered today.   Lipid Panel No results found for: "CHOL", "TRIG", "HDL", "CHOLHDL", "VLDL", "LDLCALC", "LDLDIRECT"    Wt Readings from Last 3 Encounters:  09/28/22 199 lb 3.2 oz (90.4 kg)  08/17/22 193 lb 12.8 oz (87.9 kg)  07/28/22 192 lb 14.4 oz (87.5 kg)      ASSESSMENT AND PLAN:  1  Palpitations    PT with hx  palpitations for several years   Brief   Do not appear to be hemodynamically significant  No real trigger   I do not think the neck manipulation lead to anything  I would recomm increasing Toprol XL to 25 mg bid    Follow     Stay hydrated     Would get echo      2  HTN   BP is not optimally controlled   Plan to increase Toprol XL to 25 bid   Keep on clorthalidone     3  Cardiac risk assessment    Pt with Fhx of CAD   With this and hx of HTN I would consider CT calciume score to further define goals for Rx     Current medicines are reviewed at length with the patient today.  The patient does not have concerns regarding medicines.  Signed, Dietrich Pates, MD  09/28/2022 10:54 AM    [facility] [address] [place], [place], [place]  [address] Phone: [phone]; Fax: [phone]

## 2022-10-06 ENCOUNTER — Telehealth: Payer: Self-pay | Admitting: Internal Medicine

## 2022-10-06 NOTE — Telephone Encounter (Signed)
I spoke with the pt ans we are waiting for her Echo and Ct score results for further Lipid management.

## 2022-10-06 NOTE — Telephone Encounter (Signed)
Patient states she was returning call. Please advise  ?

## 2022-10-28 ENCOUNTER — Other Ambulatory Visit (HOSPITAL_BASED_OUTPATIENT_CLINIC_OR_DEPARTMENT_OTHER)

## 2022-10-28 ENCOUNTER — Ambulatory Visit (HOSPITAL_COMMUNITY)

## 2022-11-19 ENCOUNTER — Ambulatory Visit (HOSPITAL_COMMUNITY): Attending: Cardiology

## 2022-11-19 ENCOUNTER — Ambulatory Visit (HOSPITAL_BASED_OUTPATIENT_CLINIC_OR_DEPARTMENT_OTHER)
Admission: RE | Admit: 2022-11-19 | Discharge: 2022-11-19 | Disposition: A | Discharge status: Home / Self Care | Payer: Self-pay | Source: Ambulatory Visit | Attending: Internal Medicine | Admitting: Internal Medicine

## 2022-11-19 DIAGNOSIS — R000 Tachycardia, unspecified: Secondary | ICD-10-CM | POA: Insufficient documentation

## 2022-11-19 DIAGNOSIS — R9431 Abnormal electrocardiogram [ECG] [EKG]: Secondary | ICD-10-CM | POA: Diagnosis not present

## 2022-11-19 LAB — ECHOCARDIOGRAM COMPLETE
Area-P 1/2: 4.15 cm2
S' Lateral: 2.2 cm

## 2022-11-22 ENCOUNTER — Other Ambulatory Visit: Payer: Self-pay

## 2022-11-22 DIAGNOSIS — I2510 Atherosclerotic heart disease of native coronary artery without angina pectoris: Secondary | ICD-10-CM

## 2022-11-23 ENCOUNTER — Ambulatory Visit

## 2022-11-24 ENCOUNTER — Ambulatory Visit: Attending: Internal Medicine

## 2022-11-24 DIAGNOSIS — I2510 Atherosclerotic heart disease of native coronary artery without angina pectoris: Secondary | ICD-10-CM

## 2022-11-25 ENCOUNTER — Other Ambulatory Visit: Payer: Self-pay | Admitting: Nurse Practitioner

## 2022-11-25 DIAGNOSIS — E0590 Thyrotoxicosis, unspecified without thyrotoxic crisis or storm: Secondary | ICD-10-CM

## 2022-11-25 LAB — NMR, LIPOPROFILE
Cholesterol, Total: 203 mg/dL — ABNORMAL HIGH (ref 100–199)
HDL Particle Number: 32.2 umol/L (ref >=30.5)
HDL-C: 47 mg/dL (ref >39)
LDL Particle Number: 1660 nmol/L — ABNORMAL HIGH (ref <1000)
LDL Size: 20.7 nm (ref >20.5)
LDL-C (NIH Calc): 142 mg/dL — ABNORMAL HIGH (ref 0–99)
LP-IR Score: 81 — ABNORMAL HIGH (ref <=45)
Small LDL Particle Number: 507 nmol/L (ref <=527)
Triglycerides: 75 mg/dL (ref 0–149)

## 2022-11-25 LAB — LIPOPROTEIN A (LPA): Lipoprotein (a): 70.9 nmol/L (ref <75.0)

## 2022-11-25 LAB — T3, FREE: T3, Free: 3.4 pg/mL (ref 2.0–4.4)

## 2022-11-25 LAB — T4, FREE: Free T4: 1.24 ng/dL (ref 0.82–1.77)

## 2022-11-25 LAB — TSH: TSH: 0.434 u[IU]/mL — ABNORMAL LOW (ref 0.450–4.500)

## 2022-11-29 ENCOUNTER — Other Ambulatory Visit: Payer: Self-pay

## 2022-11-29 DIAGNOSIS — I2510 Atherosclerotic heart disease of native coronary artery without angina pectoris: Secondary | ICD-10-CM

## 2022-11-29 DIAGNOSIS — Z79899 Other long term (current) drug therapy: Secondary | ICD-10-CM

## 2022-11-29 DIAGNOSIS — E782 Mixed hyperlipidemia: Secondary | ICD-10-CM

## 2022-11-29 MED ORDER — ROSUVASTATIN CALCIUM 10 MG PO TABS: 10.0000 mg | ORAL_TABLET | Freq: Every day | ORAL | 3 refills | Status: DC

## 2022-12-21 NOTE — Progress Notes (Unsigned)
Cardiology Office Note   Date:  12/22/2022   ID:  Annette Dunn, DOB 06-18-1966, MRN [id number]  PCP:  Jonathon Bellows, DO  Cardiologist:   Dietrich Pates, MD   Pt presents for follow up    History of Present Illness: Annette Dunn is a 57 y.o. female with a history of palpitations  Patient says she has had racing since 2017 Started on Toprol XL Episodes short lived, lasting a few seconds  The pt wore a Zio patch for 2 wks in Dec2023-Jan 2024.   This showed SR   Average HR 75 bpm     There were 2 bursts SVT, fastest for 4 beats (154 bpm), longest 20 beats at 127 bpm   Pt may have sensed one episode   I saw the pt in clinic in APril 2024   After this visit she went on to have Ca score CT   Ca score was 44.7 (all in LAD)   Lipomed done and then she was placed on Crestor  Since seen she has done OK  No CP   Breathing OK       Diet  Br  Oatmeal ([employer]) Lunch  [employer] chicken and fries and Dr pepper Dinner  Sometimes the same    Current Meds  Medication Sig   amLODipine (NORVASC) 5 MG tablet Take 1 tablet (5 mg total) by mouth daily.   chlorthalidone (HYGROTON) 25 MG tablet Take 25 mg by mouth daily.   ergocalciferol (VITAMIN D2) 1.25 MG (50000 UT) capsule Take by mouth.   fexofenadine (ALLEGRA) 180 MG tablet Take by mouth as needed.   fluticasone (FLONASE) 50 MCG/ACT nasal spray Place 2 sprays into both nostrils daily.   meloxicam (MOBIC) 15 MG tablet Take 15 mg by mouth daily as needed.   metoprolol succinate (TOPROL-XL) 25 MG 24 hr tablet Take 1 tablet (25 mg total) by mouth 2 (two) times daily.   montelukast (SINGULAIR) 10 MG tablet daily as needed.   Multiple Vitamin (MULTIVITAMIN) tablet Take 1 tablet by mouth daily.   rosuvastatin (CRESTOR) 10 MG tablet Take 1 tablet (10 mg total) by mouth daily.     Allergies:   Doxycycline   Past Medical History:  Diagnosis Date   Acute sinusitis    Allergic rhinitis    Back pain    BMI 35.0-35.9,adult    Cyst of nasal sinus     Deviated nasal septum    Drug-induced GI disturbance    Hidradenitis    Hypertension    Impaired fasting glucose    Muscle spasm of back    PONV (postoperative nausea and vomiting)    Sore throat    Strep pharyngitis    SVT (supraventricular tachycardia)    Vitamin D deficiency     Past Surgical History:  Procedure Laterality Date   ABDOMINAL HYSTERECTOMY     NASAL SEPTOPLASTY W/ TURBINOPLASTY Bilateral 07/28/2022   Procedure: NASAL SEPTOPLASTY WITH TURBINATE REDUCTION;  Surgeon: Scarlette Ar, MD;  Location: [facility];  Service: ENT;  Laterality: Bilateral;     Social History:  The patient  reports that she has never smoked. She has been exposed to tobacco smoke. She has never used smokeless tobacco. She reports that she does not drink alcohol and does not use drugs.   Family History:  The patient's family history includes Hypertension in her father and mother; Thyroid disease in her father and mother.  Mother with CHF   Had CHF then DM  Died at 57 yo  ROS:  Please see the history of present illness. All other systems are reviewed and  Negative to the above problem except as noted.    PHYSICAL EXAM: VS:  BP (!) 190/116   Pulse 71   Ht 5\' 4"  (1.626 m)   Wt 204 lb (92.5 kg)   SpO2 98%   BMI 35.02 kg/m    On my check BP was 170/84    GEN: Well nourished, well developed, in no acute distress  HEENT: normal  Neck: no JVD, no carotid bruit Cardiac: RRR; no murmur No LE edema  Respiratory:  clear to auscultation  GI: soft, nontender, nondistended  No hepatomegaly   EKG:  EKG is not ordered today.   Lipid Panel No results found for: "CHOL", "TRIG", "HDL", "CHOLHDL", "VLDL", "LDLCALC", "LDLDIRECT"    Wt Readings from Last 3 Encounters:  12/22/22 204 lb (92.5 kg)  09/28/22 199 lb 3.2 oz (90.4 kg)  08/17/22 193 lb 12.8 oz (87.9 kg)      ASSESSMENT AND PLAN:  1  HTN  BP is severely severly elevated   I would add amlodpine 5 mg   Continue other meds     Follow u in 1 month  2  Hx palpitatoins   Zio patch without arrhythmias   Keep on Toprol XL     3  CAD  Pt with Ca noted on CT scan   Risk factor modify  4  HL   Pt on Crestor 10 mg Will need follow up lipids   Current medicines are reviewed at length with the patient today.  The patient does not have concerns regarding medicines.  Signed, Dietrich Pates, MD  12/22/2022 10:03 PM    [facility] [address] [place], [place], [place]  [address] Phone: [phone]; Fax: [phone]

## 2022-12-22 ENCOUNTER — Ambulatory Visit: Attending: Internal Medicine | Admitting: Internal Medicine

## 2022-12-22 ENCOUNTER — Encounter: Payer: Self-pay | Admitting: Internal Medicine

## 2022-12-22 VITALS — BP 190/116 | HR 71 | Ht 64.0 in | Wt 204.0 lb

## 2022-12-22 DIAGNOSIS — I10 Essential (primary) hypertension: Secondary | ICD-10-CM | POA: Diagnosis not present

## 2022-12-22 MED ORDER — AMLODIPINE BESYLATE 5 MG PO TABS: 5.0000 mg | ORAL_TABLET | Freq: Every day | ORAL | 3 refills | Status: DC

## 2022-12-22 NOTE — Patient Instructions (Signed)
Medication Instructions:  Start amlodipine 5 mg daily. *If you need a refill on your cardiac medications before your next appointment, please call your pharmacy*   Lab Work: NMR when you come to Hypertension clinic with Pharm D  If you have labs (blood work) drawn today and your tests are completely normal, you will receive your results only by: MyChart Message (if you have MyChart) OR A paper copy in the mail If you have any lab test that is abnormal or we need to change your treatment, we will call you to review the results.     Follow-Up: At [facility], you and your health needs are our priority.  As part of our continuing mission to provide you with exceptional heart care, we have created designated Provider Care Teams.  These Care Teams include your primary Cardiologist (physician) and Advanced Practice Providers (APPs -  Physician Assistants and Nurse Practitioners) who all work together to provide you with the care you need, when you need it.  We recommend signing up for the patient portal called "MyChart".  Sign up information is provided on this After Visit Summary.  MyChart is used to connect with patients for Virtual Visits (Telemedicine).  Patients are able to view lab/test results, encounter notes, upcoming appointments, etc.  Non-urgent messages can be sent to your provider as well.   To learn more about what you can do with MyChart, go to [email or web address].    Your next appointment:   Referral to Hypertension clinic in 3 to 4 weeks.

## 2023-01-11 NOTE — Progress Notes (Unsigned)
Patient ID: Annette Dunn                 DOB: February 06, 1966                      MRN: [id number]     HPI: PAMLEA FINDER is a 57 y.o. female referred by Dr. Lindie Spruce to HTN clinic. PMH is significant for HTN, palpitations, and elevated calcium score of 44.7 (89th percentile) on 11/19/22. Pt last seen in clinic on 12/22/22 where BP was very elevated at 190/116. She was started on amlodipine 5mg  daily and referred to PharmD for follow up.  Eats at [employer] for breakfast and lunch?? Confirm med adherence and tolerating amlo (LE edema?) Inc amlo and add telmisartan  Current HTN meds:  Amlodipine 5mg  daily Chlorthalidone 25mg  daily Toprol 25mg  BID  BP goal: <130/38mmHg  Family History: HTN and thyroid disease in her mother and father. Mother also had CHF and DM.  Social History: No tobacco, alcohol or drug use.  Diet:   Exercise:   Home BP readings:   Wt Readings from Last 3 Encounters:  12/22/22 204 lb (92.5 kg)  09/28/22 199 lb 3.2 oz (90.4 kg)  08/17/22 193 lb 12.8 oz (87.9 kg)   BP Readings from Last 3 Encounters:  12/22/22 (!) 190/116  09/28/22 (!) 148/88  08/17/22 (!) 140/78   Pulse Readings from Last 3 Encounters:  12/22/22 71  09/28/22 66  08/17/22 (!) 57    Renal function: CrCl cannot be calculated (Patient's most recent lab result is older than the maximum 21 days allowed.).  Past Medical History:  Diagnosis Date   Acute sinusitis    Allergic rhinitis    Back pain    BMI 35.0-35.9,adult    Cyst of nasal sinus    Deviated nasal septum    Drug-induced GI disturbance    Hidradenitis    Hypertension    Impaired fasting glucose    Muscle spasm of back    PONV (postoperative nausea and vomiting)    Sore throat    Strep pharyngitis    SVT (supraventricular tachycardia)    Vitamin D deficiency     Current Outpatient Medications on File Prior to Visit  Medication Sig Dispense Refill   amLODipine (NORVASC) 5 MG tablet Take 1 tablet (5 mg total) by mouth daily. 90  tablet 3   chlorthalidone (HYGROTON) 25 MG tablet Take 25 mg by mouth daily.     ergocalciferol (VITAMIN D2) 1.25 MG (50000 UT) capsule Take by mouth.     fexofenadine (ALLEGRA) 180 MG tablet Take by mouth as needed.     fluticasone (FLONASE) 50 MCG/ACT nasal spray Place 2 sprays into both nostrils daily.     meloxicam (MOBIC) 15 MG tablet Take 15 mg by mouth daily as needed.     metoprolol succinate (TOPROL-XL) 25 MG 24 hr tablet Take 1 tablet (25 mg total) by mouth 2 (two) times daily. 180 tablet 3   montelukast (SINGULAIR) 10 MG tablet daily as needed.     Multiple Vitamin (MULTIVITAMIN) tablet Take 1 tablet by mouth daily.     rosuvastatin (CRESTOR) 10 MG tablet Take 1 tablet (10 mg total) by mouth daily. 90 tablet 3   No current facility-administered medications on file prior to visit.    Allergies  Allergen Reactions   Doxycycline Rash    Other reaction(s): rash/itching     Assessment/Plan:  1. Hypertension -

## 2023-01-12 ENCOUNTER — Ambulatory Visit

## 2023-01-12 ENCOUNTER — Ambulatory Visit: Attending: Internal Medicine | Admitting: Pharmacist

## 2023-01-12 VITALS — BP 134/84 | HR 70

## 2023-01-12 DIAGNOSIS — I10 Essential (primary) hypertension: Secondary | ICD-10-CM

## 2023-01-12 DIAGNOSIS — R002 Palpitations: Secondary | ICD-10-CM | POA: Insufficient documentation

## 2023-01-12 DIAGNOSIS — E782 Mixed hyperlipidemia: Secondary | ICD-10-CM

## 2023-01-12 DIAGNOSIS — I2510 Atherosclerotic heart disease of native coronary artery without angina pectoris: Secondary | ICD-10-CM

## 2023-01-12 DIAGNOSIS — R7303 Prediabetes: Secondary | ICD-10-CM | POA: Insufficient documentation

## 2023-01-12 DIAGNOSIS — Z79899 Other long term (current) drug therapy: Secondary | ICD-10-CM

## 2023-01-12 DIAGNOSIS — R931 Abnormal findings on diagnostic imaging of heart and coronary circulation: Secondary | ICD-10-CM | POA: Insufficient documentation

## 2023-01-12 NOTE — Patient Instructions (Addendum)
Your blood pressure goal is < 130/82mmHg   Continue taking your medications  Monitor your blood pressure at least 1-2 hours after taking your medication  Bring in your log of readings and your blood pressure cuff to your next visit in a month  Really try to work on cutting back on salt. Goal is < 2g a day. Decrease fast food and fried food. Replace sugary drinks with diet versions. Water is even better.  Increase activity with walking and your stair stepper. Aim for 150 minutes of activity a week  Important lifestyle changes to control high blood pressure  Intervention  Effect on the BP   Weight loss Weight loss is one of the most effective lifestyle changes for controlling blood pressure. If you're overweight or obese, losing even a small amount of weight can help reduce blood pressure.    Blood pressure can decrease by 1 millimeter of mercury (mmHg) with each kilogram (about 2.2 pounds) of weight lost.   Exercise regularly As a general goal, aim for 30 minutes of moderate physical activity every day.    Regular physical activity can lower blood pressure by 5 - 8 mmHg.   Eat a healthy diet Eat a diet rich in whole grains, fruits, vegetables, lean meat, and low-fat dairy products. Limit processed foods, saturated fat, and sweets.    A heart-healthy diet can lower high blood pressure by 10 mmHg.   Reduce salt (sodium) in your diet Aim for 000mg  of sodium each day. Avoid deli meats, canned food, and frozen microwave meals which are high in sodium.     Limiting sodium can reduce blood pressure by 5 mmHg.   Limit alcohol One drink equals 12 ounces of beer, 5 ounces of wine, or 1.5 ounces of 80-proof liquor.    Limiting alcohol to < 1 drink a day for women or < 2 drinks a day for men can help lower blood pressure by about 4 mmHg.   To check your pressure at home you will need to:   Sit up in a chair, with feet flat on the floor and back supported. Do not cross your ankles  or legs. Rest your left arm so that the cuff is about heart level. If the cuff goes on your upper arm, then just relax your arm on the table, arm of the chair, or your lap. If you have a wrist cuff, hold your wrist against your chest at heart level. Place the cuff snugly around your arm, about 1 inch above the crease of your elbow. The cords should be inside the groove of your elbow.  Sit quietly, with the cuff in place, for about 5 minutes. Then press the power button to start a reading. Do not talk or move while the reading is taking place.  Record your readings on a sheet of paper. Although most cuffs have a memory, it is often easier to see a pattern developing when the numbers are all in front of you.  You can repeat the reading after 1-3 minutes if it is recommended.   Make sure your bladder is empty and you have not had caffeine or tobacco within the last 30 minutes   Always bring your blood pressure log with you to your appointments. If you have not brought your monitor in to be double checked for accuracy, please bring it to your next appointment.   You can find a list of validated (accurate) blood pressure cuffs at: validatebp.org

## 2023-01-13 LAB — HEPATIC FUNCTION PANEL
ALT: 14 IU/L (ref 0–32)
Albumin: 4.5 g/dL (ref 3.8–4.9)
Alkaline Phosphatase: 107 IU/L (ref 44–121)
Bilirubin Total: 0.3 mg/dL (ref 0.0–1.2)
Bilirubin, Direct: 0.12 mg/dL (ref 0.00–0.40)
Total Protein: 7.1 g/dL (ref 6.0–8.5)

## 2023-01-13 LAB — NMR, LIPOPROFILE
Cholesterol, Total: 190 mg/dL (ref 100–199)
HDL Particle Number: 26.2 umol/L — ABNORMAL LOW (ref >=30.5)
HDL-C: 40 mg/dL (ref >39)
LDL Particle Number: 1710 nmol/L — ABNORMAL HIGH (ref <1000)
LDL-C (NIH Calc): 137 mg/dL — ABNORMAL HIGH (ref 0–99)
LP-IR Score: 79 — ABNORMAL HIGH (ref <=45)
Small LDL Particle Number: 1026 nmol/L — ABNORMAL HIGH (ref <=527)

## 2023-01-21 ENCOUNTER — Telehealth: Payer: Self-pay

## 2023-01-21 NOTE — Telephone Encounter (Signed)
I called the pt re: her lab results and she says she had not been taking the Rosuvastatin 10 mg.. she went on a cruise and did not have her meds with her and just did not restart them right away when she returned so it had been " a "while"... she says she restarted them a few days ago.   She rather not increase the dose and try taking what she already has.... she is seeing the Pharm D in the HTN clinic 02/08/23... and asked if she needed repeat labs then but I advised her will be too soon to repeat these particular labs... she asked that if the Pharmacist needs labs if they can be combined at some point.   She also says she had labs with her PCP 01/18/23... I will request to have those labs sent to [place].   I will forward to Dr Tenny Craw for her review.

## 2023-01-21 NOTE — Telephone Encounter (Signed)
-----   Message from [place] sent at 01/13/2023  6:34 PM EDT ----- LDL is still elevated at 137    Goal would be below 70 Is she taking Crestor 10 mg? Would increase to 40 mg? Look at diet==  whole, unprocessed food; minimize ultraprocessed foods, sugars, fatty meats  Repeat lipomed in 3 months

## 2023-01-21 NOTE — Telephone Encounter (Signed)
Repeat lipids after a couple months of lipid treatment

## 2023-01-25 ENCOUNTER — Telehealth: Payer: Self-pay | Admitting: Nurse Practitioner

## 2023-01-25 NOTE — Telephone Encounter (Signed)
PCP sent new referral.  She is scheduled for Feb 2025.  Do you need to see her sooner?

## 2023-02-08 NOTE — Progress Notes (Unsigned)
Patient ID: Annette Dunn                 DOB: February 12, 1966                      MRN: [id number]     HPI: Annette Dunn is a 57 y.o. female referred by Dr. Tenny Craw to HTN clinic. PMH is significant for HTN, palpitations, and elevated calcium score of 44.7 (89th percentile) on 11/19/22. Pt last seen in clinic on 12/22/22 where BP was very elevated at 190/116, although she had eaten [employer] (chicken sandwich, fries, Dr. Reino Kent) just before visit and was keeping her grandkids that day which was stressful. She was started on amlodipine 5mg  daily and referred to PharmD for follow up.  Last pharmacist visit on 01/12/23. Reported tolerating meds well. Thought she was supposed to stop Toprol when starting amlodipine. Initially noticed heart beating faster but since had improvement in fatigue and no bothersome palpitations which was her main indication for BB therapy. Toprol discontinued and counseled her to restart at lower dose of 25 mg daily if palpitations returned. Reported home BP 120s/70s, in clinic BP 134/84. Amlodipine 5 mg daily and chlorthalidone 25 mg daily were continued. Had also recently started on Humira and noticed spikes in BP the few days after injection, stopped the med until she was seen by cardiology. Incidence of HTN with med is ~5%. Noticed improvement in her skin on med. Counseled that she is okay to resume Humira from a cardiac perspective. Advised to record BP readings and note the day she takes Humira so we can trend values. Also reported recently prescribed Ozempic for prediabetes, but hadn't started yet. Extensively counseled on diet and lifestyle, reducing fast food and sugary beverages which would also help with tolerability of Ozempic. Discussed increasing exercise to 150 minutes weekly by walking regularly and using her stair stepper.  Pt presents today to clinic for HTN follow up. Has been on vacation recently and admits diet has not been what it should be. Reports self adjusting BP  medications if home blood pressure is lower or if she does not check her BP that day. Taking chlorthalidone 25 mg in the morning about 2 times/week. Taking amlodipine 5 mg more consistently, although takes 1/2 tablet about 2 times/week. Denies lightheadedness, dizziness, blurry vision, and headaches. Checks BP with feet flat on the floor after relaxing for a few minutes. If BP is elevated she will rest for a few more minutes and check BP in other arm. She attributes higher BP readings with days where she didn't take chlorthalidone or only took 1/2 tablet of amlodipine. Expressed concern about blood pressure being too low although none of her home BP readings have been low. Has stopped using spirulina smoothie supplement that is high in sodium, but is now using Kachava supplement that is also high in sodium. Started Ozempic recently, has had 6 doses. Reports constipation and abdominal pain after increasing to the 0.5 mg dose so she has stopped taking it (last dose 8/9). Constipation and abdominal pain have resolved and she does not plan to restart it. Also notes while taking Ozempic was waking up in the middle of the night feeling like her HR was low which caused anxiety. Has not had an episode like this in the past week after stopping Ozempic.  Current HTN meds:  Amlodipine 5mg  daily - PM (self adjusts to 1/2 tab 2x/week) Chlorthalidone 25mg  daily - AM (only taking 2x/week)  BP  goal: <130/28mmHg  Family History: HTN and thyroid disease in her mother and father. Mother also had CHF and DM.  Social History: No tobacco, alcohol or drug use.  Diet:  Breakfast - piece of toast or 2 around 10am. On the weekend, bacon or grits Lunch - skips Dinner - curry, stew, rice, pork chop, usually eats at home, if she eats out - has fried fish and french fries 1 cup of coffee a day, soda every other day but trying to cut back, some sweet tea Uses kachava in smoothies  Exercise: using stair stepper at home  2x/week  Home BP Readings: 8/20- 138/84 8/16- 129/72 8/15- 139/74, 139/81 8/14- 128/83,115/71 8/13- 129/77, 144/76 8/12- 161/83, 8/11- 134/83 8/10- 124/76 8/9- 136/80, 150/83, 129/74 8/8- 139/87, 131/76 8/6- 147/79 8/5- 114/78 8/4- 129/72, 124/75, 130/92 8/3- 126/72 8/2- 111/74, 141/72 8/1- 128/73, 129/77, 127/78, 135/80 7/31-115/68, 115/75, 120/82 7/30- 125/88 7/29- 125/79, 123/77   Wt Readings from Last 3 Encounters:  02/09/23 199 lb 12.8 oz (90.6 kg)  12/22/22 204 lb (92.5 kg)  09/28/22 199 lb 3.2 oz (90.4 kg)   BP Readings from Last 3 Encounters:  02/09/23 134/72  01/12/23 134/84  12/22/22 (!) 190/116   Pulse Readings from Last 3 Encounters:  02/09/23 70  01/12/23 70  12/22/22 71    Renal function: CrCl cannot be calculated (Patient's most recent lab result is older than the maximum 21 days allowed.).  Past Medical History:  Diagnosis Date   Acute sinusitis    Allergic rhinitis    Back pain    BMI 35.0-35.9,adult    Cyst of nasal sinus    Deviated nasal septum    Drug-induced GI disturbance    Hidradenitis    Hypertension    Impaired fasting glucose    Muscle spasm of back    PONV (postoperative nausea and vomiting)    Sore throat    Strep pharyngitis    SVT (supraventricular tachycardia)    Vitamin D deficiency     Current Outpatient Medications on File Prior to Visit  Medication Sig Dispense Refill   amLODipine (NORVASC) 5 MG tablet Take 1 tablet (5 mg total) by mouth daily. 90 tablet 3   chlorthalidone (HYGROTON) 25 MG tablet Take 25 mg by mouth daily.     ergocalciferol (VITAMIN D2) 1.25 MG (50000 UT) capsule Take by mouth.     fexofenadine (ALLEGRA) 180 MG tablet Take by mouth as needed.     fluticasone (FLONASE) 50 MCG/ACT nasal spray Place 2 sprays into both nostrils daily.     meloxicam (MOBIC) 15 MG tablet Take 15 mg by mouth daily as needed.     montelukast (SINGULAIR) 10 MG tablet daily as needed.     Multiple Vitamin  (MULTIVITAMIN) tablet Take 1 tablet by mouth daily.     rosuvastatin (CRESTOR) 10 MG tablet Take 1 tablet (10 mg total) by mouth daily. 90 tablet 3   No current facility-administered medications on file prior to visit.    Allergies  Allergen Reactions   Doxycycline Rash    Other reaction(s): rash/itching     Assessment/Plan:  1. Hypertension - BP is improved and close to goal <130/80 mmHg. Appears to be at goal at home when she consistently takes chlorthalidone 25 mg and amlodipine 5 mg daily. Higher readings correspond with days where she skipped or reduced dose of medications. Counseled patient to consistently take chlorthalidone 25 mg and amlodipine 5 mg every day and continue to monitor BP. If she  consistently has SBP<110 she will reach out to clinic. Advised her to stop Chad supplement as this is high in sodium and try to limit sodium intake to <2g daily. Plans to work on diet more now that she does not have any upcoming vacations. Encouraged her to continue exercising with stair stepper with ultimate goal of 150 minutes per week. Plan to bring in home BP cuff to next visit for validation.    F/u in 3-4 weeks for BP check.  Adam Phenix, PharmD PGY-1 Pharmacy Resident   Megan E. Supple, PharmD, BCACP, CPP [facility] [address] [place], [place] [address] Phone: [phone]; Fax: [phone] 02/09/2023 11:26 AM

## 2023-02-09 ENCOUNTER — Ambulatory Visit: Attending: Internal Medicine | Admitting: Pharmacist

## 2023-02-09 VITALS — BP 134/72 | HR 70 | Wt 199.8 lb

## 2023-02-09 DIAGNOSIS — I10 Essential (primary) hypertension: Secondary | ICD-10-CM

## 2023-02-09 NOTE — Patient Instructions (Addendum)
Your blood pressure goal is <130/80. Most of your home blood pressure readings look great when you have taken both medications every day.   Take chlorthalidone 25 mg and amlodipine 5 mg every day.   Keep checking blood pressure at home. If your systolic pressure is consistently less than 110 please give [place] a call at #[phone]  Please avoid adding salt to food and supplements that contain salt.   Follow up on Friday, 9/13 at 11:00 AM and bring blood pressure cuff to next visit so we can check it for accuracy.

## 2023-02-10 ENCOUNTER — Other Ambulatory Visit: Payer: Self-pay | Admitting: Medical Genetics

## 2023-02-10 DIAGNOSIS — Z006 Encounter for examination for normal comparison and control in clinical research program: Secondary | ICD-10-CM

## 2023-02-17 ENCOUNTER — Other Ambulatory Visit (HOSPITAL_COMMUNITY)
Admission: RE | Admit: 2023-02-17 | Discharge: 2023-02-17 | Disposition: A | Discharge status: Home / Self Care | Source: Ambulatory Visit | Attending: Oncology | Admitting: Oncology

## 2023-02-17 DIAGNOSIS — Z006 Encounter for examination for normal comparison and control in clinical research program: Secondary | ICD-10-CM | POA: Insufficient documentation

## 2023-03-04 ENCOUNTER — Ambulatory Visit

## 2023-03-09 ENCOUNTER — Telehealth: Payer: Self-pay | Admitting: *Deleted

## 2023-03-09 ENCOUNTER — Telehealth: Payer: Self-pay | Admitting: Internal Medicine

## 2023-03-09 NOTE — Telephone Encounter (Signed)
The referral team reached out to me in her case.  TSH was slightly suppressed at 0.22 but no other thyroid labs were checked at that time.  I think it is more likely that her dizziness is related to recent sinus issues.  We can certainly see her.  She is scheduled for Feb 2025 but she can be placed on cancellation list to be seen sooner.

## 2023-03-09 NOTE — Telephone Encounter (Signed)
Pt c/o medication issue:  1. Name of Medication: amLODipine (NORVASC) 5 MG tablet   2. How are you currently taking this medication (dosage and times per day)?    3. Are you having a reaction (difficulty breathing--STAT)? no  4. What is your medication issue? Patient feels that the medication is making her dizzy. Please advise

## 2023-03-09 NOTE — Telephone Encounter (Signed)
Patient was called and made aware. Telephone encounter sent to the front desk , so that patient would be placed on the cancellation list.

## 2023-03-09 NOTE — Telephone Encounter (Signed)
Patient left a voicemail. She states that her PCP called her the end of July , and shared that she was concerned as her thyroid levels were low. Annette Dunn says that she has been having symptoms. Dizziness, she has taken a fall from this. Sinus problems and Strep throat. She is wanting to come in and be evaluated.

## 2023-03-09 NOTE — Telephone Encounter (Signed)
I talked with the patient. She has been treated for the dizziness with a type of treatment that uses crystals.She said, that this better and she was told that is dizziness continued, that it would be something else . Patient is wanting to be on the cancellation list for a sooner appointment with [facility] she lives in [place]. Alphonzo Lemmings has said, that this okay.

## 2023-03-09 NOTE — Telephone Encounter (Signed)
Spoke with pt. States she's been sick for 2 weeks, diagnosed with strep throat and sinusitis. Was started on antibiotics and prednisone. States she was dizzy after and fell, wasn't sure if it was from her infection vs BP meds. Recalls SBP was 117. Still dealing with dizziness now even after her illness has resolved. Dizziness worse than when she came in for BP visit on 8/21. SBP 113-120s. Felt dizzy 10am today, checked BP and it was 125/80.  Discussed that her BP isn't low, not sure if something else is contributing to her symptoms. She hasn't taken either BP med in the past 2 days (amlodipine or chlorthalidone) and is still having dizziness and feeling unsteady on her feet. Dizzy with positional changes, feels ok when she turns her head side to side. Has been going to vestibular therapy for her sinuses because she has fluid in her ear. Reports they found crystals in her ear but they have resolved. Trying to stay hydrated with water.    Encouraged pt to call her PCP for recommendations. Will also try cutting her chlorthalidone in half until her appt next week to see if it improves her symptoms at all.

## 2023-03-15 ENCOUNTER — Ambulatory Visit: Attending: Cardiology | Admitting: Pharmacist

## 2023-03-15 VITALS — BP 134/70 | HR 66

## 2023-03-15 DIAGNOSIS — I10 Essential (primary) hypertension: Secondary | ICD-10-CM | POA: Diagnosis not present

## 2023-03-15 NOTE — Progress Notes (Signed)
Patient ID: Annette Dunn                 DOB: [birth year]                      MRN: [id number]     HPI: Annette Dunn is a 57 y.o. female referred by Dr. Tenny Craw to HTN clinic. PMH is significant for HTN, palpitations, and elevated calcium score of 44.7 (89th percentile) on 11/19/22. Pt last seen in clinic on 12/22/22 where BP was very elevated at 190/116, although she had eaten [employer] (chicken sandwich, fries, Dr. Reino Kent) just before visit and was keeping her grandkids that day which was stressful. She was started on amlodipine 5mg  daily and referred to PharmD for follow up.  At pharmacist visit on 01/12/23. Reported tolerating meds well. Thought she was supposed to stop Toprol when starting amlodipine. Initially noticed heart beating faster but since had improvement in fatigue and no bothersome palpitations which was her main indication for BB therapy. Toprol discontinued and counseled her to restart at lower dose of 25 mg daily if palpitations returned. Reported home BP 120s/70s, in clinic BP 134/84. Amlodipine 5 mg daily and chlorthalidone 25 mg daily were continued. Had also recently started on Humira and noticed spikes in BP the few days after injection, stopped the med until she was seen by cardiology. Incidence of HTN with med is ~5%. Noticed improvement in her skin on med. Counseled that she is okay to resume Humira from a cardiac perspective. Advised to record BP readings and note the day she takes Humira so we can trend values. Also reported recently prescribed Ozempic for prediabetes, but hadn't started yet. Extensively counseled on diet and lifestyle, reducing fast food and sugary beverages which would also help with tolerability of Ozempic. Discussed increasing exercise to 150 minutes weekly by walking regularly and using her stair stepper.  At last hypertension visit patient reported self adjusting blood pressure medications if blood pressure was on the lower end, although there were never any lows  reported.  Blood pressure was close to goal in clinic and amlodipine 5 mg daily and chlorthalidone 25 mg daily were continued.  She was asked not to self adjust medications and call if her systolic blood pressure drops to less than 110.  Patient called the clinic 03/09/2023 reporting that she had recently been sick and was on antibiotics and prednisone.  Reporting dizziness but no low blood pressures.  Also reports that she has been in vestibular therapy due to due to sinuses and fluid in her ears.  Also reporting crystals in her ears. No improvement when she stopped her blood pressure medicines.  Patient was encouraged to follow-up with her primary care as her dizziness did not seem to be related to her blood pressure.  Patient presents today for follow-up.  She brings in a list of blood pressure readings.  Does report that her blood pressure was high on the day she resumed Humira but then normalized.  She has a few readings in the 140s over 80s but mostly at goal.  She is has been taking half a tablet of chlorthalidone and 5 mg of amlodipine daily.  Has not exercised since the beginning of September due to her illness.  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  Stopped her antibiotics and prednisone early because after taking a hot shower she got dizzy and fell.  She started taking her antibiotic and prednisone again yesterday because she  started to feel the pressure in her head come back.  Current HTN meds:  Amlodipine 5mg  daily - PM  Chlorthalidone 12.5mg  daily - AM   BP goal: <130/39mmHg  Family History: HTN and thyroid disease in her mother and father. Mother also had CHF and DM.  Social History: No tobacco, alcohol or drug use.  Diet:  Breakfast - piece of toast or 2 around 10am. On the weekend, bacon or grits Lunch - skips Dinner - curry, stew, rice, pork chop, usually eats at home, if she eats out - has fried fish and french fries 1 cup of coffee a day, soda every other day but trying to  cut back, some sweet tea Uses kachava in smoothies  Exercise: using stair stepper at home 2x/week  Home BP Readings: 121 74  128 80  123 74  126 76  142 88  140 89  136 85  145 76  120 71  124 76  119 73  122 75  147  81  128 77  125 80  115 79  113 76  127.8824 78.23529     Wt Readings from Last 3 Encounters:  02/09/23 199 lb 12.8 oz (90.6 kg)  12/22/22 204 lb (92.5 kg)  09/28/22 199 lb 3.2 oz (90.4 kg)   BP Readings from Last 3 Encounters:  02/09/23 134/72  01/12/23 134/84  12/22/22 (!) 190/116   Pulse Readings from Last 3 Encounters:  02/09/23 70  01/12/23 70  12/22/22 71    Renal function: CrCl cannot be calculated (Patient's most recent lab result is older than the maximum 21 days allowed.).  Past Medical History:  Diagnosis Date   Acute sinusitis    Allergic rhinitis    Back pain    BMI 35.0-35.9,adult    Cyst of nasal sinus    Deviated nasal septum    Drug-induced GI disturbance    Hidradenitis    Hypertension    Impaired fasting glucose    Muscle spasm of back    PONV (postoperative nausea and vomiting)    Sore throat    Strep pharyngitis    SVT (supraventricular tachycardia)    Vitamin D deficiency     Current Outpatient Medications on File Prior to Visit  Medication Sig Dispense Refill   amLODipine (NORVASC) 5 MG tablet Take 1 tablet (5 mg total) by mouth daily. 90 tablet 3   chlorthalidone (HYGROTON) 25 MG tablet Take 12.5 mg by mouth daily.     ergocalciferol (VITAMIN D2) 1.25 MG (50000 UT) capsule Take by mouth.     fexofenadine (ALLEGRA) 180 MG tablet Take by mouth as needed.     fluticasone (FLONASE) 50 MCG/ACT nasal spray Place 2 sprays into both nostrils daily.     meloxicam (MOBIC) 15 MG tablet Take 15 mg by mouth daily as needed.     montelukast (SINGULAIR) 10 MG tablet daily as needed.     Multiple Vitamin (MULTIVITAMIN) tablet Take 1 tablet by mouth daily.     rosuvastatin (CRESTOR) 10 MG tablet Take 1 tablet (10 mg  total) by mouth daily. 90 tablet 3   No current facility-administered medications on file prior to visit.    Allergies  Allergen Reactions   Doxycycline Rash    Other reaction(s): rash/itching     Assessment/Plan:  1. Hypertension -  Assessment Blood pressure slightly elevated in clinic today Home blood pressures are well-controlled with a few outliers Home blood pressure cuff has not been validated  Plan: Continue  chlorthalidone 12.5 mg daily and amlodipine 5 mg daily Follow-up in 2 weeks Patient is to bring her home blood pressure cuff for validation If blood pressure cuff is accurate that patient can follow-up as needed Resume exercise    Olene Floss, Pharm.D, BCACP, BCPS, CPP [facility] A Division of [facility] [address] [place], [place] [address]  Phone: [phone]; Fax: [phone]

## 2023-03-15 NOTE — Patient Instructions (Signed)
Continue amlodipine 5mg  daily and chlorthalidone 12.5mg  daily Please bring your home blood pressure machine to next visit

## 2023-03-29 ENCOUNTER — Ambulatory Visit

## 2023-04-06 ENCOUNTER — Ambulatory Visit: Attending: Cardiovascular Disease | Admitting: Student

## 2023-04-06 ENCOUNTER — Encounter: Payer: Self-pay | Admitting: Student

## 2023-04-06 VITALS — BP 142/82 | HR 75

## 2023-04-06 DIAGNOSIS — I10 Essential (primary) hypertension: Secondary | ICD-10-CM | POA: Diagnosis not present

## 2023-04-06 NOTE — Patient Instructions (Addendum)
No Changes made to BP medications by your pharmacist Carmela Hurt, PharmD at today's visit:    Instructions/Changes  (what do you need to do) Your Notes  (what you did and when you did it)  Cut down on salt intake    Exercise regularly at least 20-30 min per day     Bring all of your meds, your BP cuff and your record of home blood pressures to your next appointment.    HOW TO TAKE YOUR BLOOD PRESSURE AT HOME  Rest 5 minutes before taking your blood pressure.  Don't smoke or drink caffeinated beverages for at least 30 minutes before. Take your blood pressure before (not after) you eat. Sit comfortably with your back supported and both feet on the floor (don't cross your legs). Elevate your arm to heart level on a table or a desk. Use the proper sized cuff. It should fit smoothly and snugly around your bare upper arm. There should be enough room to slip a fingertip under the cuff. The bottom edge of the cuff should be 1 inch above the crease of the elbow. Ideally, take 3 measurements at one sitting and record the average.  Important lifestyle changes to control high blood pressure  Intervention  Effect on the BP  Lose extra pounds and watch your waistline Weight loss is one of the most effective lifestyle changes for controlling blood pressure. If you're overweight or obese, losing even a small amount of weight can help reduce blood pressure. Blood pressure might go down by about 1 millimeter of mercury (mm Hg) with each kilogram (about 2.2 pounds) of weight lost.  Exercise regularly As a general goal, aim for at least 30 minutes of moderate physical activity every day. Regular physical activity can lower high blood pressure by about 5 to 8 mm Hg.  Eat a healthy diet Eating a diet rich in whole grains, fruits, vegetables, and low-fat dairy products and low in saturated fat and cholesterol. A healthy diet can lower high blood pressure by up to 11 mm Hg.  Reduce salt (sodium) in your  diet Even a small reduction of sodium in the diet can improve heart health and reduce high blood pressure by about 5 to 6 mm Hg.  Limit alcohol One drink equals 12 ounces of beer, 5 ounces of wine, or 1.5 ounces of 80-proof liquor.  Limiting alcohol to less than one drink a day for women or two drinks a day for men can help lower blood pressure by about 4 mm Hg.   If you have any questions or concerns please use My Chart to send questions or call the office at [phone]  Meal planning is the key to setting you up for success. Here are some examples of healthy meal options.  Breakfast    Option 1:  Omelette with vegetables (1 egg, spinach, mushrooms, or other vegetable of your choice), 2 slices whole-grain toast, tip of thumb size butter or soft margarine,  cup low-fat milk or yogurt   Option 2: steel-cut rolled oats (? cup dry), 1 tbsp peanut butter added to cooked oats,  cup low-fat milk.  Option 3: 2 slices whole-grain or rye toast with avocado spread ( small avocado mased with herbs and pepper to taste), 1 poached egg or [place] up (cooked to your liking)  Option 4:  cup plain 0% [place] yogurt topped with  cup berries and  cup walnuts or almonds, 2 slices whole-grain or rye toast, tip of thumb size soft  margarine/butter  Lunch:   Option 1: 2 cups red lentil soup, [place] salad with 1 tbsp homemade vinaigrette (extra virgin olive oil and vinegar of choice plus spices)  Option 2: 3 oz. roasted chicken, 2 slices whole-grain bread, 2 tsp mayonnaise, mustard, lettuce, tomato if desired, 1 fruit (example: medium-sized apple or small pear)  Option 3: 3 oz. tuna packed in water, 1 whole-wheat pita (6 inch), 2 tsp mayonnaise, lettuce, tomato, or other non-starchy vegetable of your choice, 1 fruit (example: medium-sized apple or small pear)  Option 4: 1 serving of garden veggie buddha bowl with lentils and tahini sauce and 1 cup berries topped with  cup plain 0% Greek yogurt  Dinner:   Option  1: 1 serving roasted cauliflower salad, 3-4 oz.  grilled or baked pork loin chop, 1/2 cup mashed potato, or brown rice or quinoa   Option 2: 1 serving fish (baked, grilled or air fried), King salad, 1 tbsp homemade vinaigrette,  cup cooked couscous  Option 3: 1 cup cooked whole grained pasta (example: spaghetti, spirals, macaroni),  cup favorite pasta sauce (preferably homemade), 3-4 oz.  grilled or baked chicken, [place] salad, 1 tbsp homemade vinaigrette  Option 4: 1 serving oven roasted salmon,  cup mashed sweet potato or couscous or brown rice or quinoa, broccoli (steamed or roasted)   Healthy snacks:   Carrots or celery with 1 tbsp of hummus   1 medium-sized fruit (apple or orange)  1 cup plain 0% [place] yogurt with  cup berries  Half apple, sliced, with 1 tbsp (15 mL) peanut or almond butter

## 2023-04-06 NOTE — Assessment & Plan Note (Signed)
Assessment/Plan: Blood pressure slightly elevated in clinic today patient did not take her chlorthalidone this morning  Current BP medications chlorthalidone 12.5 mg daily and amlodipine 5 mg daily Reiterated importance of med adherence - will be buying pill box to track and improve adherence Home blood pressures are well-controlled with a few outliers - last 2 weeks average ~125/79 heart rate 75  Home blood pressure cuff  validated today - reads within 10 points compared to manual readings  Patient can improve on salt intake and start doing regular exercise  Thoroughly discussed importance of med adherence, salt intake and regular exercise  Patient was provided with meal prep ideas and some meal plans, patient will be seeing PT for back pain issue so can learn some starches that relieve back pain  Patient to follow up with [place] as needed if BP persistently remains above goal

## 2023-04-06 NOTE — Progress Notes (Signed)
Patient ID: Annette Dunn                 DOB: 01-05-1966                      MRN: [id number]     HPI: Annette Dunn is a 57 y.o. female referred by Dr. Tenny Craw to HTN clinic. PMH is significant for HTN, palpitations, and elevated calcium score of 44.7 (89th percentile) on 11/19/22. Pt last seen in clinic on 12/22/22 where BP was very elevated at 190/116, although she had eaten [employer] (chicken sandwich, fries, Dr. Reino Kent) just before visit and was keeping her grandkids that day which was stressful. She was started on amlodipine 5mg  daily and referred to PharmD for follow up.  At pharmacist visit on 01/12/23. Reported tolerating meds well. Thought she was supposed to stop Toprol when starting amlodipine. Initially noticed heart beating faster but since had improvement in fatigue and no bothersome palpitations which was her main indication for BB therapy. Toprol discontinued and counseled her to restart at lower dose of 25 mg daily if palpitations returned. Reported home BP 120s/70s, in clinic BP 134/84. Amlodipine 5 mg daily and chlorthalidone 25 mg daily were continued. Had also recently started on Humira and noticed spikes in BP the few days after injection, stopped the med until she was seen by cardiology. Incidence of HTN with med is ~5%. Noticed improvement in her skin on med. Counseled that she is okay to resume Humira from a cardiac perspective. Advised to record BP readings and note the day she takes Humira so we can trend values. Also reported recently prescribed Ozempic for prediabetes, but hadn't started yet. Extensively counseled on diet and lifestyle, reducing fast food and sugary beverages which would also help with tolerability of Ozempic. Discussed increasing exercise to 150 minutes weekly by walking regularly and using her stair stepper.  At last hypertension visit patient reported self adjusting blood pressure medications if blood pressure was on the lower end, although there were never any lows  reported.  Blood pressure was close to goal in clinic and amlodipine 5 mg daily and chlorthalidone 25 mg daily were continued.  She was asked not to self adjust medications and call if her systolic blood pressure drops to less than 110.  Patient called the clinic 03/09/2023 reporting that she had recently been sick and was on antibiotics and prednisone.  Reporting dizziness but no low blood pressures.  Also reports that she has been in vestibular therapy due to due to sinuses and fluid in her ears.  Also reporting crystals in her ears. No improvement when she stopped her blood pressure medicines.  Patient was encouraged to follow-up with her primary care as her dizziness did not seem to be related to her blood pressure.  At her last visit with PharmD BP was at goal so no medication were changed and patient was advised to improve on salt intake and regular exercise.    Patient presents today for follow-up.  She brought in her home BP monitor [employer] brand that sync the BP readings to her phone application. Her last 2 weeks average ~125/79 heart rate 75 . On the day she took Humira her BP was at goal. There were some above the goal BP readings but patient admits she does not watch salt intake that religiously, eats out and someday forgot to take Chlorthalidone. We validated home BP monitor it reads within 10 points compared to office manual readings. "  I don't feel any difference when I take or don't take BP medication" patient made aware that uncontrolled BP can lead to number of serious health condition like heart problems (HF,MI) stroke and damages Kidneys and eyes ect.  We thoroughly discussed  importance of med adherence, salt intake and regular exercise. Patient will be using pill box to track and improve adherence.  1st reading on home cuff- 141/97 heart rate 76  1st reading on office cuff  141/85 heart rate 75 2nd on home monitor 140/90 heart rate 74 2nd on office cuff 142/82 heart rate 75   Current HTN  meds:  Amlodipine 5mg  daily - PM  Chlorthalidone 12.5mg  daily - AM   BP goal: <130/26mmHg  Family History: HTN and thyroid disease in her mother and father. Mother also had CHF and DM.  Social History: No tobacco, alcohol or drug use.  Diet:  Use half salt and half Mrs. Dash when cook food at home, eats out twice or 3 times per week mainly process food, likes chips   Exercise: was doing some chair exercises but it was worsening her back problem, will be going to PT again and learn back pain relieving exercises again.   Home BP Readings: ~125/79 heart rate 75     Wt Readings from Last 3 Encounters:  02/09/23 199 lb 12.8 oz (90.6 kg)  12/22/22 204 lb (92.5 kg)  09/28/22 199 lb 3.2 oz (90.4 kg)   BP Readings from Last 3 Encounters:  03/15/23 134/70  02/09/23 134/72  01/12/23 134/84   Pulse Readings from Last 3 Encounters:  03/15/23 66  02/09/23 70  01/12/23 70    Renal function: CrCl cannot be calculated (Patient's most recent lab result is older than the maximum 21 days allowed.).  Past Medical History:  Diagnosis Date   Acute sinusitis    Allergic rhinitis    Back pain    BMI 35.0-35.9,adult    Cyst of nasal sinus    Deviated nasal septum    Drug-induced GI disturbance    Hidradenitis    Hypertension    Impaired fasting glucose    Muscle spasm of back    PONV (postoperative nausea and vomiting)    Sore throat    Strep pharyngitis    SVT (supraventricular tachycardia)    Vitamin D deficiency     Current Outpatient Medications on File Prior to Visit  Medication Sig Dispense Refill   amLODipine (NORVASC) 5 MG tablet Take 1 tablet (5 mg total) by mouth daily. 90 tablet 3   chlorthalidone (HYGROTON) 25 MG tablet Take 12.5 mg by mouth daily.     ergocalciferol (VITAMIN D2) 1.25 MG (50000 UT) capsule Take by mouth.     fexofenadine (ALLEGRA) 180 MG tablet Take by mouth as needed.     fluticasone (FLONASE) 50 MCG/ACT nasal spray Place 2 sprays into both nostrils  daily.     meloxicam (MOBIC) 15 MG tablet Take 15 mg by mouth daily as needed.     montelukast (SINGULAIR) 10 MG tablet daily as needed.     Multiple Vitamin (MULTIVITAMIN) tablet Take 1 tablet by mouth daily.     rosuvastatin (CRESTOR) 10 MG tablet Take 1 tablet (10 mg total) by mouth daily. 90 tablet 3   No current facility-administered medications on file prior to visit.    Allergies  Allergen Reactions   Doxycycline Rash    Other reaction(s): rash/itching     Assessment/Plan:  1. Hypertension -  Assessment/Plan: Blood pressure  slightly elevated in clinic today patient did not take her chlorthalidone this morning  Current BP medications chlorthalidone 12.5 mg daily and amlodipine 5 mg daily Reiterated importance of med adherence - will be buying pill box to track and improve adherence Home blood pressures are well-controlled with a few outliers - last 2 weeks average ~125/79 heart rate 75  Home blood pressure cuff  validated today - reads within 10 points compared to manual readings  Patient can improve on salt intake and start doing regular exercise  Thoroughly discussed importance of med adherence, salt intake and regular exercise  Patient was provided with meal prep ideas and some meal plans, patient will be seeing PT for back pain issue so can learn some starches that relieve back pain  Patient to follow up with [place] as needed if BP persistently remains above goal       Carmela Hurt, [place].D [facility] A Division of [facility] [address], [place], [place] [address]  Phone: [phone]; Fax: [phone]

## 2023-06-13 LAB — GENECONNECT MOLECULAR SCREEN: Genetic Analysis Overall Interpretation: POSITIVE — AB

## 2023-06-14 ENCOUNTER — Telehealth: Payer: Self-pay | Admitting: Medical Genetics

## 2023-06-14 DIAGNOSIS — Z1501 Genetic susceptibility to malignant neoplasm of breast: Secondary | ICD-10-CM | POA: Insufficient documentation

## 2023-06-14 NOTE — Telephone Encounter (Signed)
[place] [facility] 06/14/2023  10:48 AM  Confirmed I was speaking with Annette Dunn [phone] by using name and DOB. Genetic counseling was offered and participant is scheduled. All questions were answered, and participant was thanked for their time and support of the above study. Participant was encouraged to contact [facility] if they have any further questions or concerns.    Newman Nip, BS [place]  [facility] Clinical Research Specialist II Direct Dial: [phone]  Fax: [phone]

## 2023-06-14 NOTE — Telephone Encounter (Signed)
Annette Dunn 06/14/2023 10:39 AM  FIRST ATTEMPT: Confirmed I was speaking with [name] [phone] by using name and DOB. Informed participant the reason for this call is to provide results for the above study. Results revealed Lynch Syndrome. Genetic counseling was offered and participant requests a call back to schedule. All questions were answered, and participant was thanked for their time and support of the above study. Participant was encouraged to contact [facility] if they have any further questions or concerns.

## 2023-08-09 ENCOUNTER — Telehealth: Payer: Self-pay | Admitting: Nurse Practitioner

## 2023-08-09 NOTE — Telephone Encounter (Signed)
 Pt needs labs updated

## 2023-08-11 ENCOUNTER — Other Ambulatory Visit: Payer: Self-pay | Admitting: *Deleted

## 2023-08-11 DIAGNOSIS — R7989 Other specified abnormal findings of blood chemistry: Secondary | ICD-10-CM

## 2023-08-11 DIAGNOSIS — E0590 Thyrotoxicosis, unspecified without thyrotoxic crisis or storm: Secondary | ICD-10-CM

## 2023-08-11 NOTE — Telephone Encounter (Signed)
Labs have been updated . 

## 2023-08-13 LAB — T4, FREE: Free T4: 1.18 ng/dL (ref 0.82–1.77)

## 2023-08-13 LAB — T3, FREE: T3, Free: 3.5 pg/mL (ref 2.0–4.4)

## 2023-08-13 LAB — TSH: TSH: 0.29 u[IU]/mL — ABNORMAL LOW (ref 0.450–4.500)

## 2023-08-18 ENCOUNTER — Ambulatory Visit: Admitting: Nurse Practitioner

## 2023-08-18 ENCOUNTER — Encounter: Payer: Self-pay | Admitting: Nurse Practitioner

## 2023-08-18 VITALS — BP 126/78 | HR 84 | Ht 64.0 in | Wt 207.0 lb

## 2023-08-18 DIAGNOSIS — R7989 Other specified abnormal findings of blood chemistry: Secondary | ICD-10-CM

## 2023-08-18 DIAGNOSIS — E0590 Thyrotoxicosis, unspecified without thyrotoxic crisis or storm: Secondary | ICD-10-CM | POA: Diagnosis not present

## 2023-08-18 NOTE — Progress Notes (Signed)
 08/18/2023     Endocrinology Follow Up Note    Subjective:    Patient ID: Annette Dunn, female    DOB: [birth year], PCP Annette Bellows, DO.   Past Medical History:  Diagnosis Date   Acute sinusitis    Allergic rhinitis    Back pain    BMI 35.0-35.9,adult    Cyst of nasal sinus    Deviated nasal septum    Drug-induced GI disturbance    Hidradenitis    Hypertension    Impaired fasting glucose    Muscle spasm of back    PONV (postoperative nausea and vomiting)    Sore throat    Strep pharyngitis    SVT (supraventricular tachycardia) (HCC)    Vitamin D deficiency     Past Surgical History:  Procedure Laterality Date   ABDOMINAL HYSTERECTOMY     NASAL SEPTOPLASTY W/ TURBINOPLASTY Bilateral 07/28/2022   Procedure: NASAL SEPTOPLASTY WITH TURBINATE REDUCTION;  Surgeon: Annette Ar, MD;  Location: [facility];  Service: ENT;  Laterality: Bilateral;    Social History   Socioeconomic History   Marital status: Married    Spouse name: Not on file   Number of children: Not on file   Years of education: Not on file   Highest education level: Not on file  Occupational History   Not on file  Tobacco Use   Smoking status: Never    Passive exposure: Past   Smokeless tobacco: Never  Vaping Use   Vaping status: Never Used  Substance and Sexual Activity   Alcohol use: No   Drug use: Never   Sexual activity: Not on file  Other Topics Concern   Not on file  Social History Narrative   Not on file   Social Drivers of Health   Financial Resource Strain: Not on file  Food Insecurity: Not on file  Transportation Needs: Not on file  Physical Activity: Not on file  Stress: Not on file  Social Connections: Not on file    Family History  Problem Relation Age of Onset   Thyroid disease Mother    Hypertension Mother    Thyroid disease Father    Hypertension Father     Outpatient Encounter Medications as of 08/18/2023  Medication Sig   adalimumab  (HUMIRA, 2 SYRINGE,) 40 MG/0.8ML prefilled syringe Inject into the skin.   amLODipine (NORVASC) 5 MG tablet Take 1 tablet (5 mg total) by mouth daily.   chlorthalidone (HYGROTON) 25 MG tablet Take 12.5 mg by mouth daily.   fexofenadine (ALLEGRA) 180 MG tablet Take by mouth as needed.   fluticasone (FLONASE) 50 MCG/ACT nasal spray Place 2 sprays into both nostrils daily.   meloxicam (MOBIC) 15 MG tablet Take 15 mg by mouth daily as needed.   montelukast (SINGULAIR) 10 MG tablet daily as needed.   Multiple Vitamin (MULTIVITAMIN) tablet Take 1 tablet by mouth daily.   rosuvastatin (CRESTOR) 10 MG tablet Take 1 tablet (10 mg total) by mouth daily.   No facility-administered encounter medications on file as of 08/18/2023.    ALLERGIES: Allergies  Allergen Reactions   Doxycycline Rash    Other reaction(s): rash/itching    VACCINATION STATUS: Immunization History  Administered Date(s) Administered   Moderna Sars-Covid-2 Vaccination 09/14/2019   PFIZER(Purple Top)SARS-COV-2 Vaccination 08/24/2019, 05/18/2020     HPI  Annette Dunn is 58 y.o. female who presents today with a medical history as above. she is being seen in follow up after being  seen in consultation for hyperthyroidism requested by Annette Bellows, DO.  she has been dealing with symptoms of intermittent palpitations, vertigo, anxiety on and off since 2004 when she had her first incidence of suppressed TSH.  her most recent thyroid labs revealed suppressed TSH of 0.22 on 10/12/20.  She has moved all over the country due to her husband being in the [employer].  She reports seeing several different specialists and undergoing several different tests for her thyroid.  She says she has had multiple uptake and scans in the past which did not show hyperthyroidism and she has had thyroid ultrasound showing suspicious nodule which needed FNA which came back inconclusive due to inadequate tissue sampling.     she does have family history of thyroid  dysfunction in her dad and daughter (both hyperthyroidism needing RAI ablation), but denies family hx of thyroid cancer. she denies personal history of goiter. she is not on any anti-thyroid medications nor on any thyroid hormone supplements. Denies use of Biotin containing supplements.  she is willing to proceed with appropriate work up and therapy for thyrotoxicosis.   Review of systems  Constitutional: + Minimally fluctuating body weight,  current Body mass index is 35.53 kg/m. , + fatigue, no subjective hyperthermia, no subjective hypothermia Eyes: no blurry vision, no xerophthalmia ENT: no sore throat, no nodules palpated in throat, + intermittent dysphagia/odynophagia, mild intermittent hoarseness Cardiovascular: no chest pain, no shortness of breath, no palpitations, no leg swelling Respiratory: no cough, no shortness of breath Gastrointestinal: no nausea/vomiting/diarrhea Musculoskeletal: no muscle/joint aches Skin: no rashes, no hyperemia Neurological: no tremors, no numbness, no tingling, no dizziness Psychiatric: no depression, no anxiety   Objective:    BP 126/78 (BP Location: Right Arm, Patient Position: Sitting, Cuff Size: Large)   Pulse 84   Ht 5\' 4"  (1.626 m)   Wt 207 lb (93.9 kg)   BMI 35.53 kg/m   Wt Readings from Last 3 Encounters:  08/18/23 207 lb (93.9 kg)  02/09/23 199 lb 12.8 oz (90.6 kg)  12/22/22 204 lb (92.5 kg)     BP Readings from Last 3 Encounters:  08/18/23 126/78  04/06/23 (!) 142/82  03/15/23 134/70                        Physical Exam- Limited  Constitutional:  Body mass index is 35.53 kg/m. , not in acute distress, normal state of mind Eyes:  EOMI, no exophthalmos Neck: Supple, mild fullness to neck noted Musculoskeletal: no gross deformities, strength intact in all four extremities, no gross restriction of joint movements Skin:  no rashes, no hyperemia Neurological: no tremor with outstretched hands   CMP     Component Value  Date/Time   NA 141 07/23/2022 1330   NA 143 05/22/2022 0000   K 4.0 07/23/2022 1330   CL 105 07/23/2022 1330   CO2 29 07/23/2022 1330   GLUCOSE 98 07/23/2022 1330   BUN 13 07/23/2022 1330   BUN 15 05/22/2022 0000   CREATININE 0.77 07/23/2022 1330   CALCIUM 9.3 07/23/2022 1330   PROT 7.1 01/12/2023 0943   ALBUMIN 4.5 01/12/2023 0943   AST 13 01/12/2023 0943   ALT 14 01/12/2023 0943   ALKPHOS 107 01/12/2023 0943   BILITOT 0.3 01/12/2023 0943   GFRNONAA >60 07/23/2022 1330   GFRAA >60 04/20/2017 1317     CBC    Component Value Date/Time   WBC 7.2 03/14/2022 1215   RBC 4.43 03/14/2022 1215  HGB 12.8 03/14/2022 1215   HCT 37.6 03/14/2022 1215   PLT 228 03/14/2022 1215   MCV 84.9 03/14/2022 1215   MCH 28.9 03/14/2022 1215   MCHC 34.0 03/14/2022 1215   RDW 12.1 03/14/2022 1215   LYMPHSABS 2.1 03/14/2022 1215   MONOABS 0.7 03/14/2022 1215   EOSABS 0.2 03/14/2022 1215   BASOSABS 0.0 03/14/2022 1215     Diabetic Labs (most recent): Lab Results  Component Value Date   HGBA1C 5.9 05/22/2022    Lipid Panel  No results found for: "CHOL", "TRIG", "HDL", "CHOLHDL", "VLDL", "LDLCALC", "LDLDIRECT", "LABVLDL"   Lab Results  Component Value Date   TSH 0.290 (L) 08/12/2023   TSH 0.434 (L) 11/24/2022   TSH 0.196 (L) 08/09/2022   TSH 0.15 (A) 05/22/2022   TSH 0.430 (L) 08/04/2021   TSH 0.833 11/20/2020   FREET4 1.18 08/12/2023   FREET4 1.24 11/24/2022   FREET4 1.15 08/09/2022   FREET4 1.21 08/04/2021   FREET4 1.16 11/20/2020     Results for ELNER, SEIFERT (MRN [id number]) as of 02/09/2021 16:12  Ref. Range 11/20/2020 19:00  TSH Latest Ref Range: 0.450 - 4.500 uIU/mL 0.833  Triiodothyronine,Free,Serum Latest Ref Range: 2.0 - 4.4 pg/mL 3.3  T4,Free(Direct) Latest Ref Range: 0.82 - 1.77 ng/dL 4.09  Thyroperoxidase Ab SerPl-aCnc Latest Ref Range: 0 - 34 IU/mL <8  Thyroglobulin Antibody Latest Ref Range: 0.0 - 0.9 IU/mL <1.0   Thyroid US from 12/18/20  Reason for study:  Suppressed TSH- mild goiter Comparison: none Findings: No adenopathy is seen in either jugular chain  Thyroid Echogenicity: Isoechoic  Right lobe: Size: 4.9 x 2.3 x 1.7 cm Nodules: Present  Left lobe: Size: 4.9 x 2.3 x 2.4 cm Nodules: Present  Isthmus: Size: 0.5 cm Nodules: Absent  Nodule 1: Gland: Left Location: Mid Composition: Mixed cystic and solid 1 pt Echogenicity: Anechoic 0 pts Shape: Wider than tall 0 pts Echogenic Foci: None or large comet tail artifact 0 pts Margin: Smooth well circumscribed 0 pts Other: Measuring up to 1.7 cm  Remaining nodules are primarily isoechoic and anechoic in nature and subcentimeter in size.  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    Latest Reference Range & Units 11/20/20 19:00 08/04/21 15:23 05/22/22 00:00 08/09/22 14:02 11/24/22 11:07 08/12/23 15:37  TSH 0.450 - 4.500 uIU/mL 0.833 0.430 (L) 0.15 ! (E) 0.196 (L) 0.434 (L) 0.290 (L)  Triiodothyronine,Free,Serum 2.0 - 4.4 pg/mL 3.3   3.3 3.4 3.5  T4,Free(Direct) 0.82 - 1.77 ng/dL 8.11 9.14  7.82 9.56 2.13  Thyroperoxidase Ab SerPl-aCnc 0 - 34 IU/mL <8       Thyroglobulin Antibody 0.0 - 0.9 IU/mL <1.0       (L): Data is abnormally low !: Data is abnormal (E): External lab result  Assessment & Plan:   1. Abnormal TSH 2. Subclinical hyperthyroidism 3. MNG  she is being seen at a kind request of Annette Bellows, DO.  Her follow up labs show stable suppressed TSH and normal FT4 and FT3 values.  She does not need any antithyroid treatment at this time.  Will plan to recheck TFTs in 1 year for surveillance.  Her ultrasound from 2022 showed multinodular thyroid with 1 nodule which is not suspicious for cancer (I suspect this is the nodule that was previously biopsied which came back as inadequate tissue sampling).  I did order ultrasound after last visit to be done, was done in March 2024 at [facility], however, the results of the  ultrasound were never forwarded for me to review.  Will request that to be sent over.  She notes she participated in a genetics study that identified she is at risk of developing certain types of cancer (says thyroid cancer was named amongst them).    -Patient is advised to maintain close follow up with Annette Bellows, DO for primary care needs.     I spent  24  minutes in the care of the patient today including review of labs from Thyroid Function, CMP, and other relevant labs ; imaging/biopsy records (current and previous including abstractions from other facilities); face-to-face time discussing  her lab results and symptoms, medications doses, her options of short and long term treatment based on the latest standards of care / guidelines;   and documenting the encounter.  Annette Dunn  participated in the discussions, expressed understanding, and voiced agreement with the above plans.  All questions were answered to her satisfaction. she is encouraged to contact clinic should she have any questions or concerns prior to her return visit.   Follow up plan: Return in about 1 year (around 08/17/2024) for Thyroid follow up, Previsit labs.   Thank you for involving me in the care of this pleasant patient, and I will continue to update you with her progress.  Ronny Bacon, [facility] [address] [place], [place] [address] Phone: [phone] Fax: [phone]  08/18/2023, 9:50 AM

## 2023-10-05 ENCOUNTER — Other Ambulatory Visit: Payer: Self-pay | Admitting: Medical Genetics

## 2023-10-05 DIAGNOSIS — Z1509 Genetic susceptibility to other malignant neoplasm: Secondary | ICD-10-CM

## 2023-10-05 NOTE — Progress Notes (Signed)
 Abnormal GeneConnect result for Lynch syndrome. Patient had genetic counseling and was recommended to have another visit with a local genetic counselor for expanded testing - order being placed. Genetic counseling note can be found under the GeneConnect encounter from 02/10/2023.

## 2023-11-08 ENCOUNTER — Other Ambulatory Visit: Payer: Self-pay | Admitting: Genetic Counselor

## 2023-11-08 ENCOUNTER — Encounter: Payer: Self-pay | Admitting: Genetic Counselor

## 2023-11-08 ENCOUNTER — Other Ambulatory Visit: Payer: Self-pay

## 2023-11-08 ENCOUNTER — Inpatient Hospital Stay: Payer: Self-pay | Attending: Genetic Counselor | Admitting: Genetic Counselor

## 2023-11-08 DIAGNOSIS — Z8042 Family history of malignant neoplasm of prostate: Secondary | ICD-10-CM | POA: Diagnosis not present

## 2023-11-08 DIAGNOSIS — Z1379 Encounter for other screening for genetic and chromosomal anomalies: Secondary | ICD-10-CM | POA: Diagnosis not present

## 2023-11-08 DIAGNOSIS — Z803 Family history of malignant neoplasm of breast: Secondary | ICD-10-CM | POA: Diagnosis not present

## 2023-11-08 NOTE — Progress Notes (Signed)
 REFERRING PROVIDER: Haldeman-Englert, [place], MD  PRIMARY PROVIDER:  Ava Lei, DO  PRIMARY REASON FOR VISIT:  Encounter Diagnoses  Name Primary?   Family history of prostate cancer Yes   Family history of breast cancer    HISTORY OF PRESENT ILLNESS:   Annette Dunn, a 58 y.o. female, was seen for a [place] cancer genetics consultation at the request of Dr. Nelson Bandy due to a family history of cancer.  Annette Dunn presents to clinic today to discuss the possibility of a hereditary predisposition to cancer, to discuss genetic testing, and to further clarify her future cancer risks, as well as potential cancer risks for family members.   Annette Dunn is a 58 y.o. female with no personal history of cancer. Annette Dunn had genetic testing performed through the [facility] study. 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 She had post-test genetic counseling and the genetic counselor recommended more comprehensive genetic testing due to her family history of prostate and breast cancer. Annette Dunn presents to clinic today to discuss this in more detail.   I connected with Annette Dunn on 11/08/2023 at 11:00 EDT by Webex video conference and verified that I am speaking with the correct person using two identifiers.   Patient location: Home in [place], [place] Provider location: [facility]  RISK FACTORS:  Menarche was at age 62.  First live birth at age 56.  OCP use for approximately 10 years.  Ovaries intact: yes.  Uterus intact: no.  Menopausal status: premenopausal HRT use: 0 years. Colonoscopy: yes; she reports her most recent colonoscopy was in 2021 and polyps were identified Mammogram within the last year: yes. Number of breast biopsies: 0. Any excessive radiation exposure in the past: no  Past Medical History:  Diagnosis Date   Acute sinusitis    Allergic rhinitis    Back pain    BMI 35.0-35.9,adult    Cyst of  nasal sinus    Deviated nasal septum    Drug-induced GI disturbance    Hidradenitis    Hypertension    Impaired fasting glucose    Muscle spasm of back    PONV (postoperative nausea and vomiting)    Sore throat    Strep pharyngitis    SVT (supraventricular tachycardia) (HCC)    Vitamin D  deficiency     Past Surgical History:  Procedure Laterality Date   ABDOMINAL HYSTERECTOMY     NASAL SEPTOPLASTY W/ TURBINOPLASTY Bilateral 07/28/2022   Procedure: NASAL SEPTOPLASTY WITH TURBINATE REDUCTION;  Surgeon: Rush Coupe, MD;  Location: [facility];  Service: ENT;  Laterality: Bilateral;    Social History   Socioeconomic History   Marital status: Married    Spouse name: Not on file   Number of children: Not on file   Years of education: Not on file   Highest education level: Not on file  Occupational History   Not on file  Tobacco Use   Smoking status: Never    Passive exposure: Past   Smokeless tobacco: Never  Vaping Use   Vaping status: Never Used  Substance and Sexual Activity   Alcohol use: No   Drug use: Never   Sexual activity: Not on file  Other Topics Concern   Not on file  Social History Narrative   Not on file   Social Drivers of Health   Financial Resource Strain: Not on file  Food Insecurity: Not on file  Transportation Needs:  Not on file  Physical Activity: Not on file  Stress: Not on file  Social Connections: Not on file     FAMILY HISTORY:  We obtained a detailed, 4-generation family history.  Significant diagnoses are listed below: Family History  Problem Relation Age of Onset   Thyroid  disease Mother    Hypertension Mother    Thyroid  disease Father    Hypertension Father    Prostate cancer Father 102 - 86   Prostate cancer Maternal Uncle        metastatic   Cancer Maternal Uncle    Cancer Maternal Uncle    Prostate cancer Maternal Grandfather    Breast cancer Cousin        dx. >50   Prostate cancer Cousin      Annette Dunn  is unaware of previous family history of genetic testing for hereditary cancer risks. There is no reported Ashkenazi Jewish ancestry.   GENETIC COUNSELING ASSESSMENT: Annette Dunn is a 58 y.o. female with a family history of cancer which is somewhat suggestive of a hereditary predisposition to cancer. We, therefore, discussed and recommended the following at today's visit.   DISCUSSION: During today's session we reviewed Annette Dunn's [facility] genetic testing results. We specifically discussed that only 7 hereditary cancer genes were analyzed on the test. She already received genetic counseling regarding the cancer risks and management recommendations for individuals who have a mutation in the MSH6 gene. Therefore, we did not discuss them in detail outside of clarifying a few recommendations including the consideration of a bilateral oophorectomy and having EGDs ever 2-4 years. She is aware she is recommended to have colonoscopies every 1-3 years.   We discussed that more comprehensive genetic testing is beneficial for several reasons, including knowing about other cancer risks, identifying potential screening and risk-reduction options that may be appropriate, and to understanding if other family members could be at risk for cancer and allowing them to undergo genetic testing.  We reviewed the characteristics, features and inheritance patterns of hereditary cancer syndromes. We also discussed genetic testing, including the appropriate family members to test, the process of testing, insurance coverage and turn-around-time for results. We discussed the implications of a positive, carrier and/or variant of uncertain significant result. We recommended Annette Dunn pursue genetic testing for a panel that contains genes associated with prostate and breast cancer.  Annette Dunn was offered a common hereditary cancer panel (40 genes) and an expanded pan-cancer panel (77 genes). Annette Dunn was informed of the benefits and  limitations of each panel, including that expanded pan-cancer panels contain several genes that do not have clear management guidelines at this point in time.  We also discussed that as the number of genes included on a panel increases, the chances of variants of uncertain significance increases.  After considering the benefits and limitations of each gene panel, Annette Dunn elected to have [facility] CancerNext-Expanded Panel.  The CancerNext-Expanded gene panel offered by [facility] and includes sequencing, rearrangement, and RNA analysis for the following 77 genes: AIP, ALK, APC, ATM, AXIN2, BAP1, BARD1, BMPR1A, BRCA1, BRCA2, BRIP1, CDC73, CDH1, CDK4, CDKN1B, CDKN2A, CEBPA, CHEK2, CTNNA1, DDX41, DICER1, ETV6, FH, FLCN, GATA2, LZTR1, MAX, MBD4, MEN1, MET, MLH1, MSH2, MSH3, MSH6, MUTYH, NF1, NF2, NTHL1, PALB2, PHOX2B, PMS2, POT1, PRKAR1A, PTCH1, PTEN, RAD51C, RAD51D, RB1, RET, RPS20, RUNX1, SDHA, SDHAF2, SDHB, SDHC, SDHD, SMAD4, SMARCA4, SMARCB1, SMARCE1, STK11, SUFU, TMEM127, TP53, TSC1, TSC2, VHL, and WT1 (sequencing and deletion/duplication); EGFR, HOXB13, KIT, MITF, PDGFRA, POLD1, and POLE (sequencing only);  EPCAM and GREM1 (deletion/duplication only).    Based on Ms. Tacey's family history of cancer, she meets medical criteria for genetic testing. Despite that she meets criteria, she may still have an out of pocket cost. We discussed that if her out of pocket cost for testing is over $100, the laboratory should contact them to discuss self-pay prices, patient pay assistance programs, if applicable, and other billing options. We did not review [facility] because Ms. Blumstein already had positive genetic testing through [facility].   PLAN: After considering the risks, benefits, and limitations, Ms. Galentine provided informed consent to pursue genetic testing and the blood sample will be collected on 11/15/2023 for [facility] CancerNext-Expanded Panel. Results should be available within approximately 2-3 weeks' time, at  which point they will be disclosed by telephone to Ms. Sax, as will any additional recommendations warranted by these results. Ms. Casares will receive a summary of her genetic counseling visit and a copy of her results once available. This information will also be available in Epic.   Ms. Mossberg questions were answered to her satisfaction today. Our contact information was provided should additional questions or concerns arise. Thank you for the referral and allowing us  to share in the care of your patient.   Luverne Zerkle, MS, [facility] Genetic Counselor [email or web address] (P) [phone]  70 minutes were spent on the date of the encounter in service to the patient including preparation, face-to-face consultation, documentation and care coordination.  The patient was seen alone.  Drs. Gudena and/or Maryalice Smaller were available to discuss this case as needed.  _______________________________________________________________________ For Office Staff:  Number of people involved in session: 1 Was an Intern/ student involved with case: no

## 2023-11-15 ENCOUNTER — Other Ambulatory Visit

## 2023-11-15 ENCOUNTER — Inpatient Hospital Stay

## 2023-12-06 ENCOUNTER — Encounter: Payer: Self-pay | Admitting: Neurology

## 2023-12-06 ENCOUNTER — Ambulatory Visit (INDEPENDENT_AMBULATORY_CARE_PROVIDER_SITE_OTHER): Admitting: Neurology

## 2023-12-06 VITALS — BP 162/84 | HR 73 | Ht 64.0 in | Wt 204.0 lb

## 2023-12-06 DIAGNOSIS — H539 Unspecified visual disturbance: Secondary | ICD-10-CM | POA: Diagnosis not present

## 2023-12-06 DIAGNOSIS — G43711 Chronic migraine without aura, intractable, with status migrainosus: Secondary | ICD-10-CM | POA: Diagnosis not present

## 2023-12-06 DIAGNOSIS — G43109 Migraine with aura, not intractable, without status migrainosus: Secondary | ICD-10-CM | POA: Insufficient documentation

## 2023-12-06 DIAGNOSIS — H53453 Other localized visual field defect, bilateral: Secondary | ICD-10-CM | POA: Diagnosis not present

## 2023-12-06 DIAGNOSIS — R51 Headache with orthostatic component, not elsewhere classified: Secondary | ICD-10-CM

## 2023-12-06 DIAGNOSIS — R519 Headache, unspecified: Secondary | ICD-10-CM

## 2023-12-06 MED ORDER — RIZATRIPTAN BENZOATE 10 MG PO TBDP: 10.0000 mg | ORAL_TABLET | ORAL | 11 refills | Status: AC | PRN

## 2023-12-06 MED ORDER — AJOVY 225 MG/1.5ML ~~LOC~~ SOAJ: 225.0000 mg | SUBCUTANEOUS | 11 refills | Status: DC

## 2023-12-06 NOTE — Patient Instructions (Addendum)
 [facility] brain  Options:  Acute when you have the migraine: Rizatriptan or new medications include Nurtec/ubrelvy Preventative: I prefer the new medication class CGRP such as Ajovy/Emgality/Qulipta. Also Botox is wonderful.   Plan: Try acutely:  rizatriptan as needed Start on Ajovy monthl. Watch for injection site reaction and elevated blood pressure.   There is increased risk for stroke in women with migraine with aura and a contraindication for the combined contraceptive pill for use by women who have migraine with aura. The risk for women with migraine without aura is lower. However other risk factors like smoking are far more likely to increase stroke risk than migraine. There is a recommendation for no smoking and for the use of OCPs without estrogen such as progestogen only pills particularly for women with migraine with aura.Annette Dunn People who have migraine headaches with auras may be 3 times more likely to have a stroke caused by a blood clot, compared to migraine patients who don't see auras. Women who take hormone-replacement therapy may be 30 percent more likely to suffer a clot-based stroke than women not taking medication containing estrogen. Other risk factors like smoking and high blood pressure may be  much more important. And stroke is still a rare complication due to migraine aura and is controversial and lower doses may not cause a risk.  Fremanezumab Injection What is this medication? FREMANEZUMAB (fre ma NEZ ue mab) prevents migraines. It works by blocking a substance in the body that causes migraines. It is a monoclonal antibody. This medicine may be used for other purposes; ask your health care provider or pharmacist if you have questions. COMMON BRAND NAME(S): AJOVY What should I tell my care team before I take this medication? They need to know if you have any of these conditions: An unusual or allergic reaction to fremanezumab, other medications, foods, dyes, or  preservatives Pregnant or trying to get pregnant Breast-feeding How should I use this medication? This medication is injected under the skin. You will be taught how to prepare and give it. Take it as directed on the prescription label. Keep taking it unless your care team tells you to stop. It is important that you put your used needles and syringes in a special sharps container. Do not put them in a trash can. If you do not have a sharps container, call your pharmacist or care team to get one. Talk to your care team about the use of this medication in children. Special care may be needed. Overdosage: If you think you have taken too much of this medicine contact a poison control center or emergency room at once. NOTE: This medicine is only for you. Do not share this medicine with others. What if I miss a dose? If you miss a dose, take it as soon as you can. If it is almost time for your next dose, take only that dose. Do not take double or extra doses. What may interact with this medication? Interactions are not expected. This list may not describe all possible interactions. Give your health care provider a list of all the medicines, herbs, non-prescription drugs, or dietary supplements you use. Also tell them if you smoke, drink alcohol, or use illegal drugs. Some items may interact with your medicine. What should I watch for while using this medication? Tell your care team if your symptoms do not start to get better or if they get worse. What side effects may I notice from receiving this medication? Side effects that you should  report to your care team as soon as possible: Allergic reactions or angioedema--skin rash, itching or hives, swelling of the face, eyes, lips, tongue, arms, or legs, trouble swallowing or breathing Side effects that usually do not require medical attention (report to your care team if they continue or are bothersome): Pain, redness, or irritation at injection site This  list may not describe all possible side effects. Call your doctor for medical advice about side effects. You may report side effects to FDA at 1-800-FDA-1088. Where should I keep my medication? Keep out of the reach of children and pets. Store in a refrigerator or at room temperature between 20 and 25 degrees C (68 and 77 degrees F). Refrigeration (preferred): Store in the refrigerator. Do not freeze. Keep in the original container until you are ready to take it. Remove the dose from the carton about 30 minutes before it is time for you to use it. If the dose is not used, it may be stored in the original container at room temperature for 7 days. Get rid of any unused medication after the expiration date. Room Temperature: This medication may be stored at room temperature for up to 7 days. Keep it in the original container. Protect from light until time of use. If it is stored at room temperature, get rid of any unused medication after 7 days or after it expires, whichever is first. To get rid of medications that are no longer needed or have expired: Take the medication to a medication take-back program. Check with your pharmacy or law enforcement to find a location. If you cannot return the medication, ask your pharmacist or care team how to get rid of this medication safely. NOTE: This sheet is a summary. It may not cover all possible information. If you have questions about this medicine, talk to your doctor, pharmacist, or health care provider.  2024 Elsevier/Gold Standard (2021-07-31 00:00:00)Rizatriptan Disintegrating Tablets What is this medication? RIZATRIPTAN (rye za TRIP tan) treats migraines. It works by blocking pain signals and narrowing blood vessels in the brain. It belongs to a group of medications called triptans. It is not used to prevent migraines. This medicine may be used for other purposes; ask your health care provider or pharmacist if you have questions. COMMON BRAND NAME(S):  Maxalt-MLT What should I tell my care team before I take this medication? They need to know if you have any of these conditions: Circulation problems in fingers and toes Diabetes Heart disease High blood pressure High cholesterol History of irregular heartbeat History of stroke Stomach or intestine problems Tobacco use An unusual or allergic reaction to rizatriptan, other medications, foods, dyes, or preservatives Pregnant or trying to get pregnant Breast-feeding How should I use this medication? Take this medication by mouth. Take it as directed on the prescription label. You do not need water to take this medication. Leave the tablet in the sealed pack until you are ready to take it. With dry hands, open the pack and gently remove the tablet. If the tablet breaks or crumbles, throw it away. Use a new tablet. Place the tablet on the tongue and allow it to dissolve. Then, swallow it. Do not cut, crush, or chew this medication. Do not use it more often than directed. Talk to your care team about the use of this medication in children. While it may be prescribed for children as young as 6 years for selected conditions, precautions do apply. Overdosage: If you think you have taken too much of  this medicine contact a poison control center or emergency room at once. NOTE: This medicine is only for you. Do not share this medicine with others. What if I miss a dose? This does not apply. This medication is not for regular use. What may interact with this medication? Do not take this medication with any of the following: Ergot alkaloids, such as dihydroergotamine, ergotamine MAOIs, such as Marplan, Nardil, Parnate Other medications for migraine headache, such as almotriptan, eletriptan, frovatriptan, naratriptan, sumatriptan, zolmitriptan This medication may also interact with the following: Certain medications for depression, anxiety, or other mental health conditions Propranolol This list may  not describe all possible interactions. Give your health care provider a list of all the medicines, herbs, non-prescription drugs, or dietary supplements you use. Also tell them if you smoke, drink alcohol, or use illegal drugs. Some items may interact with your medicine. What should I watch for while using this medication? Visit your care team for regular checks on your progress. Tell your care team if your symptoms do not start to get better or if they get worse. This medication may affect your coordination, reaction time, or judgment. Do not drive or operate machinery until you know how this medication affects you. Sit up or stand slowly to reduce the risk of dizzy or fainting spells. If you take migraine medications for 10 or more days a month, your migraines may get worse. Keep a diary of headache days and medication use. Contact your care team if your migraine attacks occur more frequently. What side effects may I notice from receiving this medication? Side effects that you should report to your care team as soon as possible: Allergic reactions--skin rash, itching, hives, swelling of the face, lips, tongue, or throat Burning, pain, tingling, or color changes in the hands, arms, legs, or feet Heart attack--pain or tightness in the chest, shoulders, arms, or jaw, nausea, shortness of breath, cold or clammy skin, feeling faint or lightheaded Heart rhythm changes--fast or irregular heartbeat, dizziness, feeling faint or lightheaded, chest pain, trouble breathing Increase in blood pressure Irritability, confusion, fast or irregular heartbeat, muscle stiffness, twitching muscles, sweating, high fever, seizure, chills, vomiting, diarrhea, which may be signs of serotonin syndrome Raynaud syndrome--cool, numb, or painful fingers or toes that may change color from pale, to blue, to red Seizures Stroke--sudden numbness or weakness of the face, arm, or leg, trouble speaking, confusion, trouble walking, loss  of balance or coordination, dizziness, severe headache, change in vision Sudden or severe stomach pain, bloody diarrhea, fever, nausea, vomiting Vision loss Side effects that usually do not require medical attention (report to your care team if they continue or are bothersome): Dizziness Unusual weakness or fatigue This list may not describe all possible side effects. Call your doctor for medical advice about side effects. You may report side effects to FDA at 1-800-FDA-1088. Where should I keep my medication? Keep out of the reach of children and pets. Store at room temperature between 15 and 30 degrees C (59 and 86 degrees F). Protect from light and moisture. Get rid of any unused medication after the expiration date. To get rid of medications that are no longer needed or have expired: Take the medication to a medication take-back program. Check with your pharmacy or law enforcement to find a location. If you cannot return the medication, check the label or package insert to see if the medication should be thrown out in the garbage or flushed down the toilet. If you are not sure, ask  your care team. If it is safe to put it in the trash, empty the medication out of the container. Mix the medication with cat litter, dirt, coffee grounds, or other unwanted substance. Seal the mixture in a bag or container. Put it in the trash. NOTE: This sheet is a summary. It may not cover all possible information. If you have questions about this medicine, talk to your doctor, pharmacist, or health care provider.  2024 Elsevier/Gold Standard (2021-10-08 00:00:00)

## 2023-12-06 NOTE — Progress Notes (Signed)
 [facility]    Provider:  Dr Tresia Fruit Requesting Provider: Ava Lei, DO Primary Care Provider:  Ava Lei, DO  CC:  headaches  HPI:  Annette Dunn is a 58 y.o. female here as requested by Ava Lei, DO for headaches.  Past medical history includes hypertension, hyperlipidemia, impaired fasting glucose (A1c is increased to 6.2), abnormal TSH following with endocrinology, thyroid  nodule, headaches with history of migraines, sinusitis treated with antibiotics, vitamin D  deficiency, obesity BMI 35-39, back pain, chronic sinusitis, muscle spasms of back, vitamin D  deficiency, work-related injury.  I reviewed notes from [facility] Dr. Lynetta Saran I do not see any history or further details other than headache unspecified from date of visit August 24, 2023.  Review of epic and Care Everywhere, I was able to locate 1 note from November 2023 [facility] network [place] [facility] which stated she had pain and pressure on the right side of the eye associated with seeing bright spots, she did see an eye doctor who ordered a testing for temporal arteritis. She has chronic ethmoidal sinusitis last seen 03/2023 by ENT per Dr. Ralston Burkes notes s/p nasal septoplasty and turbinate reduction on 07/28/2022, chronic ethmoidal sinusitis, BPPV, TMJ,and at the time reported recuring sinus infections, postnasal drip, pressure in cheeks,dizziness and EMT thought she still may have chronic ethmoidal sinusitis and he discussed starting topical budesonide steroid rinses daily to address this and if no improvement she will contact his office to possibly obtain repeat imaging to determine if further surgery would be indicated. Also, Patient has complaints of bilateral submandibular and tenderness I discussed medical management including adequate hydration, sialagogues, warm compresses. If no relief of symptoms I may recommend referral to [facility] for salivary gland evaluation; Patient has signs  symptoms of TMJ syndrome discussed pathophysiology, management including bite guard and muscle massage. Patient will consider trialing another bite guard with her dentist.Today Dix-Hallpike performed demonstrating left BPPV. Epley performed with relief of symptoms. Patient is established with physical therapy and will continue with PT. If ongoing symptoms may benefit from referral to neuro otologist for surgical options. Gets migraines about 12 days inn a month, can last 4-12 hours, at least 1/2 the month is headache days. She snores, may consider a sleep apnea test in the future has been tested in the past keep an eye on it. Headaches can be in the morning supine/positional. No other focal neurologic deficits, associated symptoms, inciting events or modifiable factors.   She has headaches, always thought it was sinus related, sinus pain, she has chronic ethmoidal sinusitis but ENT stated it wasn;t just sinus problems s/p surgery, headaches started in 2013, under the eyes(corner of the eyebrows), she sometimes has tunnel vision before the headache, peripheral vision loss, she has retinal detachment and floaters, thy feel central in the forehead, pressure on the medial eyebrow helps, the pain is severe she can;t even speak, also pain in the neck at the back of the head, throbbing, light sensitivity, souns sensitivity, nausea when severe, she has a fhx of migraines her mother had migraines she had them so bad it feels liks he hair is standing up, affecting quality of her life, vision changes before the migraine. Headaces are worsening, can have vision changes. Headaches worsening in freq and severity. Can have vision loss in the periphery and vision changes with the migraines/headaches. No other focal neurologic deficits, associated symptoms, inciting events or modifiable factors.   Reviewed notes, labs and imaging from outside physicians, which showed:  Reviewed labs 08/17/2023 hemoglobin hematocrit normal,  MCV normal 86, sodium potassium chloride CO2 calcium  normal, glucose elevated 116, BUN/creatinine normal 17/0.7, alk phos, AST, ALT normal, EGFR normal, vitamin D  66 normal.  From a thorough review of records and patient report, Medications tried that can be used in migraine/headache management greater than 3 months include: Lifestyle modification, headache diaries, better sleep hygiene, exercise, management of migraine triggers, OTC and prescribed analgesics/nsaids such as ibuprofen, excedrin, alleve and others, amlodipine , gabapentin, metoprolol , topiramate contrindicated due to kidney stones, lexapro, amitriptyline,   Results CT sinus-face 05/27/2022  IMPRESSION: Postsurgical changes to the paranasal sinuses, as described.  There is mild mucosal thickening within the bilateral ethmoid air cells/ethmoidectomy cavities and maxillary sinuses. Additionally, a 13 mm mucous retention cyst is present within the left maxillary sinus.  Rightward deviation and rightward bony spurring of the nasal septum.  Mild mucosal thickening within the bilateral nasal passages.   Electronically Signed By: Bascom Lily D.O. On: 06/10/2022 08:30   Review of Systems: Patient complains of symptoms per HPI as well as the following symptoms none. Pertinent negatives and positives per HPI. All others negative.   Social History   Socioeconomic History   Marital status: Married    Spouse name: Not on file   Number of children: Not on file   Years of education: Not on file   Highest education level: Not on file  Occupational History   Not on file  Tobacco Use   Smoking status: Never    Passive exposure: Past   Smokeless tobacco: Never  Vaping Use   Vaping status: Never Used  Substance and Sexual Activity   Alcohol use: No   Drug use: Never   Sexual activity: Not on file  Other Topics Concern   Not on file  Social History Narrative   Pt lives with family    Pt not working    Social Drivers of  Corporate investment banker Strain: Not on file  Food Insecurity: Not on file  Transportation Needs: Not on file  Physical Activity: Not on file  Stress: Not on file  Social Connections: Not on file  Intimate Partner Violence: Not on file    Family History  Problem Relation Age of Onset   Thyroid  disease Mother    Hypertension Mother    Thyroid  disease Father    Hypertension Father    Prostate cancer Father 57 - 69   Prostate cancer Maternal Uncle        metastatic   Cancer Maternal Uncle    Cancer Maternal Uncle    Prostate cancer Maternal Grandfather    Breast cancer Cousin        dx. >50   Prostate cancer Cousin    Migraines Neg Hx    Headache Neg Hx     Past Medical History:  Diagnosis Date   Acute sinusitis    Allergic rhinitis    Back pain    BMI 35.0-35.9,adult    Cyst of nasal sinus    Deviated nasal septum    Drug-induced GI disturbance    Hidradenitis    Hypertension    Impaired fasting glucose    Muscle spasm of back    PONV (postoperative nausea and vomiting)    Sore throat    Strep pharyngitis    SVT (supraventricular tachycardia) (HCC)    Vitamin D  deficiency     Patient Active Problem List   Diagnosis Date Noted   Chronic migraine without aura, with  intractable migraine, so stated, with status migrainosus 12/06/2023   Migraine with aura and without status migrainosus, not intractable 12/06/2023   MSH6 gene mutation positive 06/14/2023   Essential (primary) hypertension 01/12/2023   Palpitations 01/12/2023   Elevated coronary artery calcium  score 01/12/2023   Prediabetes 01/12/2023    Past Surgical History:  Procedure Laterality Date   ABDOMINAL HYSTERECTOMY     NASAL SEPTOPLASTY W/ TURBINOPLASTY Bilateral 07/28/2022   Procedure: NASAL SEPTOPLASTY WITH TURBINATE REDUCTION;  Surgeon: Rush Coupe, MD;  Location: [facility];  Service: ENT;  Laterality: Bilateral;    Current Outpatient Medications  Medication Sig Dispense  Refill   amLODipine  (NORVASC ) 5 MG tablet Take 1 tablet (5 mg total) by mouth daily. 90 tablet 3   chlorthalidone (HYGROTON) 25 MG tablet Take 12.5 mg by mouth daily.     fexofenadine (ALLEGRA) 180 MG tablet Take by mouth as needed.     fluticasone (FLONASE) 50 MCG/ACT nasal spray Place 2 sprays into both nostrils daily.     Fremanezumab-vfrm (AJOVY) 225 MG/1.5ML SOAJ Inject 225 mg into the skin every 30 (thirty) days. 1.5 mL 11   meloxicam (MOBIC) 15 MG tablet Take 15 mg by mouth daily as needed.     montelukast (SINGULAIR) 10 MG tablet daily as needed.     Multiple Vitamin (MULTIVITAMIN) tablet Take 1 tablet by mouth daily.     rizatriptan (MAXALT-MLT) 10 MG disintegrating tablet Take 1 tablet (10 mg total) by mouth as needed for migraine. May repeat in 2 hours if needed 12 tablet 11   No current facility-administered medications for this visit.    Allergies as of 12/06/2023 - Review Complete 12/06/2023  Allergen Reaction Noted   Doxycycline Rash 04/25/2012    Vitals: BP (!) 162/84   Pulse 73   Ht 5' 4 (1.626 m)   Wt 204 lb (92.5 kg)   BMI 35.02 kg/m  Last Weight:  Wt Readings from Last 1 Encounters:  12/06/23 204 lb (92.5 kg)   Last Height:   Ht Readings from Last 1 Encounters:  12/06/23 5' 4 (1.626 m)     Physical exam: Exam: Gen: NAD, conversant, well nourised, obese, well groomed                     CV: RRR, no MRG. No Carotid Bruits. No peripheral edema, warm, nontender Eyes: Conjunctivae clear without exudates or hemorrhage  Neuro: Detailed Neurologic Exam  Speech:    Speech is normal; fluent and spontaneous with normal comprehension.  Cognition:    The patient is oriented to person, place, and time;     recent and remote memory intact;     language fluent;     normal attention, concentration,     fund of knowledge Cranial Nerves:    The pupils are equal, round, and reactive to light. The fundi are normal and spontaneous venous pulsations are present.  Visual fields are full to finger confrontation. Extraocular movements are intact. Trigeminal sensation is intact and the muscles of mastication are normal. The face is symmetric. The palate elevates in the midline. Hearing intact. Voice is normal. Shoulder shrug is normal. The tongue has normal motion without fasciculations.   Coordination: nml  Gait: nml  Motor Observation:    No asymmetry, no atrophy, and no involuntary movements noted. Tone:    Normal muscle tone.    Posture:    Posture is normal. normal erect    Strength:    Strength is  V/V in the upper and lower limbs.      Sensation: intact to LT     Reflex Exam:  DTR's:    Deep tendon reflexes in the upper and lower extremities are normal bilaterally.   Toes:    The toes are downgoing bilaterally.   Clonus:    Clonus is absent.    Assessment/Plan:  Patient with chronic migraines   She snores, may consider a sleep apnea test in the future has been tested in the past keep an eye on it. States her BP is not this elevated, she forgot to take her medication for HTN   MRi brain w/wo contrast: MRI brain due to concerning symptoms of morning headaches, positional headaches,vision changes, worsening headaches  to look for space occupying mass, chiari or intracranial hypertension (pseudotumor), strokes, malignancies, vasculidities, demyelination(multiple sclerosis) or other  Options discssed:  Acute when you have the migraine: Rizatriptan or new medications include Nurtec/ubrelvy Preventative: I prefer the new medication class CGRP such as Ajovy/Emgality/Qulipta. Also Botox is wonderful.   Plan today: Try acutely:  rizatriptan as needed Start on Ajovy monthl. Watch for injection site reaction and elevated blood pressure.  Discussed: There is increased risk for stroke in women with migraine with aura and a contraindication for the combined contraceptive pill for use by women who have migraine with aura. The risk for women  with migraine without aura is lower. However other risk factors like smoking are far more likely to increase stroke risk than migraine. There is a recommendation for no smoking and for the use of OCPs without estrogen such as progestogen only pills particularly for women with migraine with aura.Aaron Aas People who have migraine headaches with auras may be 3 times more likely to have a stroke caused by a blood clot, compared to migraine patients who don't see auras. Women who take hormone-replacement therapy may be 30 percent more likely to suffer a clot-based stroke than women not taking medication containing estrogen. Other risk factors like smoking and high blood pressure may be  much more important. And stroke is still a rare complication due to migraine aura and is controversial and lower doses may not cause a risk.  Orders Placed This Encounter  Procedures   MR BRAIN W WO CONTRAST   Meds ordered this encounter  Medications   rizatriptan (MAXALT-MLT) 10 MG disintegrating tablet    Sig: Take 1 tablet (10 mg total) by mouth as needed for migraine. May repeat in 2 hours if needed    Dispense:  12 tablet    Refill:  11   Fremanezumab-vfrm (AJOVY) 225 MG/1.5ML SOAJ    Sig: Inject 225 mg into the skin every 30 (thirty) days.    Dispense:  1.5 mL    Refill:  11   Also spent time education of migraines and migraine management, medication choices, acute vs prevention, strategies as well as below To prevent or relieve headaches, try the following: Cool Compress. Lie down and place a cool compress on your head.  Avoid headache triggers. If certain foods or odors seem to have triggered your migraines in the past, avoid them. A headache diary might help you identify triggers.  Include physical activity in your daily routine. Try a daily walk or other moderate aerobic exercise.  Manage stress. Find healthy ways to cope with the stressors, such as delegating tasks on your to-do list.  Practice relaxation  techniques. Try deep breathing, yoga, massage and visualization.  Eat regularly. Eating regularly scheduled meals and  maintaining a healthy diet might help prevent headaches. Also, drink plenty of fluids.  Follow a regular sleep schedule. Sleep deprivation might contribute to headaches Consider biofeedback. With this mind-body technique, you learn to control certain bodily functions -- such as muscle tension, heart rate and blood pressure -- to prevent headaches or reduce headache pain.    Proceed to emergency room if you experience new or worsening symptoms or symptoms do not resolve, if you have new neurologic symptoms or if headache is severe, or for any concerning symptom.   Provided education and documentation including informational packet on: migraines, symptoms as well as aura aura, what causes migraines, migraine triggers, chronic migraine medication overuse headache, chronic migraines, prevention of migraines both acute and preventative and the different choices and classes, nondrug treatments, behavioral and other nonpharmacologic treatments for headache.   Cc: Ava Lei, DO,  Ava Lei, DO  Aldona Amel, MD  [facility] [address] [place], [place] [address]  Phone [phone] Fax [phone]

## 2023-12-07 ENCOUNTER — Telehealth: Payer: Self-pay | Admitting: Neurology

## 2023-12-07 NOTE — Telephone Encounter (Signed)
 no auth required sent to [facility] [phone]

## 2023-12-16 ENCOUNTER — Ambulatory Visit (HOSPITAL_COMMUNITY)

## 2023-12-19 ENCOUNTER — Ambulatory Visit (HOSPITAL_COMMUNITY)
Admission: RE | Admit: 2023-12-19 | Discharge: 2023-12-19 | Disposition: A | Discharge status: Home / Self Care | Source: Ambulatory Visit | Attending: Neurology | Admitting: Neurology

## 2023-12-19 DIAGNOSIS — H53453 Other localized visual field defect, bilateral: Secondary | ICD-10-CM | POA: Insufficient documentation

## 2023-12-19 DIAGNOSIS — H539 Unspecified visual disturbance: Secondary | ICD-10-CM | POA: Diagnosis present

## 2023-12-19 DIAGNOSIS — R519 Headache, unspecified: Secondary | ICD-10-CM | POA: Insufficient documentation

## 2023-12-19 DIAGNOSIS — R51 Headache with orthostatic component, not elsewhere classified: Secondary | ICD-10-CM | POA: Insufficient documentation

## 2023-12-19 MED ORDER — GADOBUTROL 1 MMOL/ML IV SOLN
9.0000 mL | Freq: Once | INTRAVENOUS | Status: AC | PRN
  Administered 2023-12-19: 9 mL via INTRAVENOUS

## 2023-12-22 ENCOUNTER — Telehealth: Payer: Self-pay | Admitting: Neurology

## 2023-12-22 NOTE — Telephone Encounter (Signed)
 Pt called to request to speak to MD or Nurse Pt states she had and  MRI  and not have broken out allover her face and hands  and arms the patient states there are tiny bums in these places on her body . Pt informed her blood pressure has been all over the place and may think it from the MRI. Pt would like  to know what she should do   Pt also wanted to know what was process of receiving her her medication Ajovy  Pt Pharmacy  informed her that Insurance was waiting on MD to approved medicationFremanezumab-vfrm (AJOVY ) 225 MG/1.5ML SOAJ

## 2023-12-22 NOTE — Telephone Encounter (Signed)
 I called pt. She had MRI w/wo contrast 6/30-2025.  The next day  7/1 she noted bumps on her R arm fine.  On 7/2 noted L hand and forearm and her face broken out.  Some itching,  wrist 6 spots, the 3 on forearm.  She scratched and noted.  No trouble breathing.  She took allegra 12-21-2023 and helped some better.  She said her bp was elevated 160 sys. She said she felt lethargic as well.   She is back to normal today.  She does not have any other areas other then mentioned.  She has not eaten or been exposed to anything else different she says. I told her to take benadryl/ allegra for itching, cortisone cream for spots if needed and if things get worse to seek care at urgent care/ breathing issues the ED.  I would place gadolinium in her allergies.  And let Dr. Ines know.  She is out of the office now.  Pt verbalized understanding.

## 2023-12-26 ENCOUNTER — Ambulatory Visit: Payer: Self-pay | Admitting: Neurology

## 2024-01-24 ENCOUNTER — Inpatient Hospital Stay: Attending: Genetic Counselor

## 2024-01-24 DIAGNOSIS — Z1379 Encounter for other screening for genetic and chromosomal anomalies: Secondary | ICD-10-CM

## 2024-01-24 LAB — GENETIC SCREENING ORDER

## 2024-02-14 ENCOUNTER — Encounter: Payer: Self-pay | Admitting: Genetic Counselor

## 2024-02-14 ENCOUNTER — Telehealth: Payer: Self-pay | Admitting: Genetic Counselor

## 2024-02-14 ENCOUNTER — Other Ambulatory Visit: Payer: Self-pay | Admitting: Internal Medicine

## 2024-02-14 DIAGNOSIS — Z1379 Encounter for other screening for genetic and chromosomal anomalies: Secondary | ICD-10-CM | POA: Insufficient documentation

## 2024-02-14 NOTE — Telephone Encounter (Addendum)
 I contacted Ms. Henshaw to discuss her genetic testing results. No new pathogenic variants were identified in the additional genes analyzed. 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 Detailed clinic note to follow.  The test report has been scanned into EPIC and is located under the Molecular Pathology section of the Results Review tab.  A portion of the result report is included below for reference.   Paschal Blanton, MS, [facility] Genetic Counselor [email or web address] (P) [phone]

## 2024-02-17 ENCOUNTER — Ambulatory Visit: Payer: Self-pay | Admitting: Genetic Counselor

## 2024-02-17 DIAGNOSIS — Z1379 Encounter for other screening for genetic and chromosomal anomalies: Secondary | ICD-10-CM

## 2024-02-17 NOTE — Progress Notes (Signed)
 HPI:   Annette Dunn was previously seen in the [facility] due to a family history of cancer and concerns regarding a hereditary predisposition to cancer. Please refer to our prior cancer genetics clinic note for more information regarding our discussion, assessment and recommendations, at the time. Annette Dunn recent genetic test results were disclosed to her, as were recommendations warranted by these results. These results and recommendations are discussed in more detail below.  CANCER HISTORY:  Oncology History   No history exists.    FAMILY HISTORY:  We obtained a detailed, 4-generation family history.  Significant diagnoses are listed below:      Family History  Problem Relation Age of Onset   Thyroid  disease Mother     Hypertension Mother     Thyroid  disease Father     Hypertension Father     Prostate cancer Father 78 - 56   Prostate cancer Maternal Uncle          metastatic   Cancer Maternal Uncle     Cancer Maternal Uncle     Prostate cancer Maternal Grandfather     Breast cancer Cousin          dx. >50   Prostate cancer Cousin             Ms. Annette Dunn is unaware of previous family history of genetic testing for hereditary cancer risks. There is no reported Ashkenazi Jewish ancestry.   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 This was the previous mutation identified on her GeneConnect testing.   The remaining 76 genes analyzed were Negative.   The test report has been scanned into EPIC and is located under the Molecular Pathology section of the Results Review tab.  A portion of the result report is included below for reference. Genetic testing reported out on 02/14/2024.       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  At this time, it is unknown if this variant is associated with an increased risk  for cancer or if it is benign, but most uncertain variants are reclassified to benign. It should not be used to make medical management decisions. With time, we suspect the laboratory will determine the significance of this variant, if any. If the laboratory reclassifies this variant, we will attempt to contact Annette Dunn to discuss it further.   Clinical Information Pathogenic variants in the MSH6 gene are associated with Lynch syndrome.  The cancers associated with MSH6 are: Colorectal cancer, 10-44% risk (average age of diagnosis is 49-69) Endometrial cancer, 16-49% risk (average age of diagnosis is 38-55) Ovarian cancer, up to a 13% risk (average age of diagnosis is 50) Renal pelvis and/or ureter, up to a 5.5% risk (average age of diagnosis is 75-69) Bladder cancer, up to an 8.2% risk (average age of diagnosis is 48) Gastric cancer, up to a 7.9% risk (2 cases reported at ages 29 and 34) Small bowel cancer, up to a 4% risk (average age of diagnosis is 84) Brain cancer, up to a 1.8% risk (average age of diagnosis is 66-54)   Management Recommendations:  Colorectal Cancer Screening: High quality colonoscopy at age 98-35 or 2-5 years prior to the earliest colon cancer if it is diagnosed before age 39 and repeat every 1-3 years Consider using daily aspirin to reduce the risk of colorectal cancer. The decision to use aspirin should  be made on an individual basis, including discussion of individual risks, benefits, adverse effects, and childbearing plans.  Endometrial Cancer Screening/Risk Reduction: Women should report any abnormal uterine bleeding or postmenopausal bleeding. The evaluation of these symptoms should include an endometrial biopsy. A hysterectomy may be considered. The timing should be individualized based on whether childbearing is complete, comorbidities, and family history. Endometrial cancer screening does not have a proven benefit in women with Lynch Syndrome. However,  endometrial biopsy is highly sensitive and specific as a diagnostic procedure. Screening via endometrial biopsy every 1-2 years starting at age 10-35 can be considered. Transvaginal ultrasounds may be considered in postmenopausal women at their clinician's discretion.  Ovarian Cancer Screening/Risk Reduction: Insufficient evidence exists to make a specific recommendation for a prophylactic bilateral salpingo-oophorectomy (BSO), or having the ovaries and fallopian tubes removed. The decision to have a BSO should be individualized. Given the higher risk for endometrial cancer and ovarian cancer, hysterectomy with opportunistic bilateral salpingectomy may be considered starting at age 73, with delayed bilateral oophorectomy starting at age 56. Women should be aware of symptoms that might be associated with the development of ovarian cancer including pelvic or abdominal pain, bloating, increased abdominal girth, difficulty eating, early satiety, or urinary frequency or urgency. Symptoms that persist for several weeks and are a change from a woman's baseline should prompt evaluation by her physician. Current data does not support routine ovarian screening for Lynch syndrome, therefore it may be considered at the clinician's discretion. Screening includes transvaginal ultrasounds and a blood test to measure CA-125 levels every 6-12 months.  Urothelial Cancer Screening: Annual urinalysis starting at age 59-35 may be considered in selected individuals such as those with a family history of urothelial cancer (renal pelvis, ureter, and/or bladder).   Gastric and Small Bowel Cancer Screening: Esophagogastroduodenoscopy (EGD) starting at age 59-40 years and repeat every 2-4 years, preferably performed in conjunction with colonoscopy.  Age of initiation prior to 30 years and/or surveillance interval less than 2 years may be considered based on family history or high-risk endoscopic findings. Random biopsy of the  proximal and distal stomach should at minimum be performed on the initial procedure to assess for H. Pylori, autoimmune gastritis, and intestinal metaplasia.  Push enteroscopy can be considered in place of EGD to enhance small bowel visualization. Individuals not undergoing upper endoscopic surveillance should have one-time noninvasive testing for H. pylori at the time of Lynch Syndrome diagnosis, with treatment indicated if H. pylori is detected.    Pancreatic Cancer Screening: Avoid smoking, heavy alcohol use, and obesity. It has been suggested that pancreatic cancer screening be limited to those with a family history of pancreatic cancer (first- or second-degree relative). Ideally, screening should be performed in experienced centers utilizing a multidisciplinary approach under research conditions. Recommended screening include annual endoscopic ultrasound (preferred) and/or MRI of the pancreas starting at age 57 or 64 years younger than the earliest age diagnosis in the family. Annual concurrent CA19-9 testing should also be considered.  Brain Cancer Screening: Patients should be educated regarding signs and symptoms of neurologic cancer and the importance of prompt reporting of abnormal symptoms to their physicians.  Prostate Cancer Screening: Consider beginning annual PSA blood test and digital rectal exams at age 104.  Additional Considerations: Patients of reproductive age should be made aware of options for prenatal diagnosis and assisted reproduction including pre-implantation genetic diagnosis. Individuals with a single pathogenic MSH6 variant are also carriers of constitutional MMR deficiency (CMMRD) syndrome. CMMRD is a childhood-onset cancer  predisposition syndrome that can present with hematological malignancies, cancers of the brain and central nervous system, Lynch syndrome-associated cancers (colon, uterine, small bowel, urinary tract), embryonic tumors, and sarcomas. Some affected  individuals may also display cafe-au-lait macules. For there to be a risk of CMMRD in offspring, an individual and their partner would each have to have a single pathogenic variant in the same MMR gene; in such a case, the risk of having an affected child is 25%.   This information is based on current understanding of the gene and may change in the future.  Implications for Family Members: Hereditary predisposition to cancer due to pathogenic variants in the MSH6 gene has autosomal dominant inheritance. This means that an individual with a pathogenic variant has a 50% chance of passing the condition on to his/her offspring. Most cases are inherited from a parent, but some cases may occur spontaneously (i.e., an individual with a pathogenic variant has parents who do not have it). Identification of a pathogenic variant allows for the recognition of at-risk relatives who can pursue testing for the familial variant.  Family members are encouraged to consider genetic testing for this familial pathogenic variant. As there are generally no childhood cancer risks associated with pathogenic variants in the MSH6 gene, individuals in the family are not recommended to have testing until they reach at least 59 years of age. They may contact our office at [phone]) for more information or to schedule an appointment.  Complimentary testing for the familial variant is available for 90 days.  Family members who live outside of the area are encouraged to find a genetic counselor in their area by visiting: [email or web address].  Resources: FORCE (Facing Our Risk of Cancer Empowered) is a resource for those with a hereditary predisposition to develop cancer.  FORCE provides information about risk reduction, advocacy, legislation, and clinical trials.  Additionally, FORCE provides a platform for collaboration and support which includes: peer navigation, message boards, local support groups, a  toll-free helpline, research registry and recruitment, advocate training, published medical research, webinars, brochures, mastectomy photos, and more.  For more information, visit www.facingourrisk.org  Hereditary Colon Cancer Foundation- www.hcctakesguts.org  Lynch Syndrome [employer]- www.lynchcancers.com  [employer]- www.kintalk.org  Our contact number was provided. Ms. Hartung questions were answered to her satisfaction, and she knows she is welcome to call us  at anytime with additional questions or concerns.   Shyne Resch, MS, [facility] Genetic Counselor [email or web address] (P) [phone]

## 2024-03-23 ENCOUNTER — Telehealth: Payer: Self-pay | Admitting: Adult Health

## 2024-03-23 NOTE — Telephone Encounter (Signed)
 Pt says ajovy  was never approved for her, does she needs a new rx or another attempt at approval??

## 2024-03-23 NOTE — Telephone Encounter (Signed)
 Pt also says she was at urgent care last night and was diagnosed with peripheal neuropathy, she wants to know who to see for this. She has only seen [facility] and [place] here.

## 2024-03-24 ENCOUNTER — Ambulatory Visit (HOSPITAL_COMMUNITY)
Admission: RE | Admit: 2024-03-24 | Discharge: 2024-03-24 | Disposition: A | Discharge status: Home / Self Care | Source: Ambulatory Visit | Attending: Family Medicine | Admitting: Family Medicine

## 2024-03-24 ENCOUNTER — Encounter (HOSPITAL_COMMUNITY): Payer: Self-pay

## 2024-03-24 ENCOUNTER — Other Ambulatory Visit: Payer: Self-pay

## 2024-03-24 VITALS — BP 151/81 | HR 78 | Temp 98.8°F | Resp 18

## 2024-03-24 DIAGNOSIS — L02411 Cutaneous abscess of right axilla: Secondary | ICD-10-CM | POA: Diagnosis not present

## 2024-03-24 MED ORDER — LIDOCAINE HCL 2 % IJ SOLN
INTRAMUSCULAR | Status: AC
  Filled 2024-03-24: qty 20

## 2024-03-24 MED ORDER — SULFAMETHOXAZOLE-TRIMETHOPRIM 800-160 MG PO TABS: 1.0000 | ORAL_TABLET | Freq: Two times a day (BID) | ORAL | 0 refills | Status: AC

## 2024-03-24 NOTE — ED Notes (Signed)
 Patient was requesting to sit a few moments.  Denied feeling unwell, but wanting to wait a few minutes after procedure. Provided ice.gingerale and peanut butter crackers.  [name]

## 2024-03-24 NOTE — ED Provider Notes (Signed)
 [facility]   [id number] 03/24/24 Arrival Time: 1142  ASSESSMENT & PLAN:  1. Abscess of axilla, right     Incision and Drainage Procedure Note  Anesthesia: 2% plain lidocaine   Procedure Details  The procedure, risks and complications have been discussed in detail (including, but not limited to pain and bleeding) with the patient.  The skin induration was prepped and draped in the usual fashion. After adequate local anesthesia, I&D with a #11 blade was performed on the right axilla with copious, bloody, purulent drainage.  EBL: minimal Drains: none Packing: 1/2 iodoform Condition: Tolerated procedure well Complications: none.  Meds ordered this encounter  Medications   sulfamethoxazole-trimethoprim (BACTRIM DS) 800-160 MG tablet    Sig: Take 1 tablet by mouth 2 (two) times daily for 7 days.    Dispense:  14 tablet    Refill:  0   Wound care instructions discussed and given in written format. To return in 48 hours for wound check.  Finish all antibiotics. OTC analgesics as needed.  Reviewed expectations re: course of current medical issues. Questions answered. Outlined signs and symptoms indicating need for more acute intervention. Patient verbalized understanding. After Visit Summary given.   SUBJECTIVE:  Annette Dunn is a 58 y.o. female who presents with a possible infection of her R axilla; h/o recurrent; surgery in past for same; this time x 1 week; more pressure and pain over past few days; denies drainage/bleeding.  OBJECTIVE:  Vitals:   03/24/24 1205  BP: (!) 151/81  Pulse: 78  Resp: 18  Temp: 98.8 F (37.1 C)  TempSrc: Oral  SpO2: 97%    General appearance: alert; no distress R axilla: approx 1.5 cm induration of her R axilla; tender to touch; no active drainage or bleeding Psychological: alert and cooperative; normal mood and affect  Allergies  Allergen Reactions   Gadolinium Derivatives Other (See Comments)    Rash, itching    Doxycycline Rash    Other reaction(s): rash/itching    Past Medical History:  Diagnosis Date   Acute sinusitis    Allergic rhinitis    Back pain    BMI 35.0-35.9,adult    Cyst of nasal sinus    Deviated nasal septum    Drug-induced GI disturbance    Hidradenitis    Hypertension    Impaired fasting glucose    Muscle spasm of back    PONV (postoperative nausea and vomiting)    Sore throat    Strep pharyngitis    SVT (supraventricular tachycardia)    Vitamin D  deficiency    Social History   Socioeconomic History   Marital status: Married    Spouse name: Not on file   Number of children: Not on file   Years of education: Not on file   Highest education level: Not on file  Occupational History   Not on file  Tobacco Use   Smoking status: Never    Passive exposure: Past   Smokeless tobacco: Never  Vaping Use   Vaping status: Never Used  Substance and Sexual Activity   Alcohol use: No   Drug use: Never   Sexual activity: Not on file  Other Topics Concern   Not on file  Social History Narrative   Pt lives with family    Pt not working    Social Drivers of Corporate investment banker Strain: Low Risk  (12/23/2023)   Received from [facility]   Overall Financial Resource Strain (CARDIA)  Difficulty of Paying Living Expenses: Not hard at all  Food Insecurity: No Food Insecurity (12/23/2023)   Received from [facility]   Hunger Vital Sign    Within the past 12 months, you worried that your food would run out before you got the money to buy more.: Never true    Within the past 12 months, the food you bought just didn't last and you didn't have money to get more.: Never true  Transportation Needs: No Transportation Needs (12/23/2023)   Received from [facility]    In the past 12 months, has lack of transportation kept you from medical appointments or from getting medications?: No    Lack of  Transportation (Non-Medical): No  Physical Activity: Not on file  Stress: Not on file  Social Connections: Not on file   Family History  Problem Relation Age of Onset   Thyroid  disease Mother    Hypertension Mother    Thyroid  disease Father    Hypertension Father    Prostate cancer Father 79 - 59   Prostate cancer Maternal Uncle        metastatic   Cancer Maternal Uncle    Cancer Maternal Uncle    Prostate cancer Maternal Grandfather    Breast cancer Cousin        dx. >50   Prostate cancer Cousin    Migraines Neg Hx    Headache Neg Hx    Past Surgical History:  Procedure Laterality Date   ABDOMINAL HYSTERECTOMY     NASAL SEPTOPLASTY W/ TURBINOPLASTY Bilateral 07/28/2022   Procedure: NASAL SEPTOPLASTY WITH TURBINATE REDUCTION;  Surgeon: Luciano Standing, MD;  Location: [facility];  Service: ENT;  Laterality: Coral Rolinda Rogue, MD 03/24/24 1408

## 2024-03-24 NOTE — ED Triage Notes (Signed)
 Patient has history of boils.  This abscess is right axilla.  Patient has been seen at [facility] [place] .  That location put patient on antibiotics-this appt was 2 days ago.  Reports abscess is getting larger, painful and red Patient also feels like she has an upper respiratory infection Patient has been taking antibiotic

## 2024-03-26 ENCOUNTER — Other Ambulatory Visit: Payer: Self-pay | Admitting: Neurology

## 2024-03-26 ENCOUNTER — Encounter (HOSPITAL_COMMUNITY): Payer: Self-pay

## 2024-03-26 ENCOUNTER — Ambulatory Visit (HOSPITAL_COMMUNITY)
Admission: EM | Admit: 2024-03-26 | Discharge: 2024-03-26 | Disposition: A | Discharge status: Home / Self Care | Attending: Family Medicine | Admitting: Family Medicine

## 2024-03-26 VITALS — BP 154/83 | HR 87 | Temp 97.8°F | Resp 16

## 2024-03-26 DIAGNOSIS — Z4800 Encounter for change or removal of nonsurgical wound dressing: Secondary | ICD-10-CM

## 2024-03-26 DIAGNOSIS — G43711 Chronic migraine without aura, intractable, with status migrainosus: Secondary | ICD-10-CM

## 2024-03-26 DIAGNOSIS — L02411 Cutaneous abscess of right axilla: Secondary | ICD-10-CM | POA: Diagnosis not present

## 2024-03-26 MED ORDER — AJOVY 225 MG/1.5ML ~~LOC~~ SOAJ: 225.0000 mg | SUBCUTANEOUS | 5 refills | Status: DC

## 2024-03-26 NOTE — ED Triage Notes (Signed)
 Pt returns today for recheck of abscess to right axilla.  Pt st's area has been draining and packing is still in place

## 2024-03-26 NOTE — ED Notes (Signed)
 Pt also st's she thinks she has a sinus infection  St's she had Riggins mucus this am

## 2024-03-26 NOTE — ED Provider Notes (Signed)
 [facility]    CSN: [id number] Arrival date & time: 03/26/24  1114      History   Chief Complaint Chief Complaint  Patient presents with   Recheck of abscess     HPI Annette Dunn is a 58 y.o. female.   Annette Dunn is a 58 y.o. female presenting for chief complaint of Recheck of abscess.  She is requesting packing removal today.  Abscess to the right axilla was drained in the urgent care 2 days ago on March 24, 2024.  She is taking Bactrim as prescribed.  She has continued to have purulent/bloody drainage from the abscess.  Taking Tylenol  for pain with some relief.  Denies fever, chills, body aches, nausea, vomiting.     Past Medical History:  Diagnosis Date   Acute sinusitis    Allergic rhinitis    Back pain    BMI 35.0-35.9,adult    Cyst of nasal sinus    Deviated nasal septum    Drug-induced GI disturbance    Hidradenitis    Hypertension    Impaired fasting glucose    Muscle spasm of back    PONV (postoperative nausea and vomiting)    Sore throat    Strep pharyngitis    SVT (supraventricular tachycardia)    Vitamin D  deficiency     Patient Active Problem List   Diagnosis Date Noted   Genetic testing 02/14/2024   Chronic migraine without aura, with intractable migraine, so stated, with status migrainosus 12/06/2023   Migraine with aura and without status migrainosus, not intractable 12/06/2023   MSH6 gene mutation positive 06/14/2023   Essential (primary) hypertension 01/12/2023   Palpitations 01/12/2023   Elevated coronary artery calcium  score 01/12/2023   Prediabetes 01/12/2023    Past Surgical History:  Procedure Laterality Date   ABDOMINAL HYSTERECTOMY     NASAL SEPTOPLASTY W/ TURBINOPLASTY Bilateral 07/28/2022   Procedure: NASAL SEPTOPLASTY WITH TURBINATE REDUCTION;  Surgeon: Luciano Standing, MD;  Location: [facility];  Service: ENT;  Laterality: Bilateral;    OB History   No obstetric history on file.      Home  Medications    Prior to Admission medications   Medication Sig Start Date End Date Taking? Authorizing Provider  amLODipine  (NORVASC ) 5 MG tablet TAKE 1 TABLET (5 MG TOTAL) BY MOUTH DAILY. 02/14/24   Okey Vina GAILS, MD  chlorthalidone (HYGROTON) 25 MG tablet Take 12.5 mg by mouth daily.    [provider]  clindamycin  (CLEOCIN  T) 1 % SWAB Apply topically daily. 11/24/23   [provider]  fluticasone (FLONASE) 50 MCG/ACT nasal spray Place 2 sprays into both nostrils daily. 05/06/21   [provider]  Fremanezumab -vfrm (AJOVY ) 225 MG/1.5ML SOAJ Inject 225 mg into the skin every 30 (thirty) days. Patient not taking: Reported on 03/24/2024 12/06/23   Ines Onetha NOVAK, MD  meloxicam (MOBIC) 15 MG tablet Take 15 mg by mouth daily as needed. 02/20/22   [provider]  montelukast (SINGULAIR) 10 MG tablet daily as needed. 03/27/18   [provider]  Multiple Vitamin (MULTIVITAMIN) tablet Take 1 tablet by mouth daily.    [provider]  rizatriptan  (MAXALT -MLT) 10 MG disintegrating tablet Take 1 tablet (10 mg total) by mouth as needed for migraine. May repeat in 2 hours if needed 12/06/23   Ahern, Antonia B, MD  sulfamethoxazole-trimethoprim (BACTRIM DS) 800-160 MG tablet Take 1 tablet by mouth 2 (two) times daily for 7 days. 03/24/24 03/31/24  Hagler,  Redell, MD    Family History Family History  Problem Relation Age of Onset   Thyroid  disease Mother    Hypertension Mother    Thyroid  disease Father    Hypertension Father    Prostate cancer Father 13 - 71   Prostate cancer Maternal Uncle        metastatic   Cancer Maternal Uncle    Cancer Maternal Uncle    Prostate cancer Maternal Grandfather    Breast cancer Cousin        dx. >50   Prostate cancer Cousin    Migraines Neg Hx    Headache Neg Hx     Social History Social History   Tobacco Use   Smoking status: Never    Passive exposure: Past   Smokeless tobacco: Never  Vaping Use   Vaping  status: Never Used  Substance Use Topics   Alcohol use: No   Drug use: Never     Allergies   Gadolinium derivatives and Doxycycline   Review of Systems Review of Systems Per HPI  Physical Exam Triage Vital Signs ED Triage Vitals [03/26/24 1126]  Encounter Vitals Group     BP (!) 154/83     Girls Systolic BP Percentile      Girls Diastolic BP Percentile      Boys Systolic BP Percentile      Boys Diastolic BP Percentile      Pulse Rate 87     Resp 16     Temp 97.8 F (36.6 C)     Temp Source Oral     SpO2 98 %     Weight      Height      Head Circumference      Peak Flow      Pain Score      Pain Loc      Pain Education      Exclude from Growth Chart    No data found.  Updated Vital Signs BP (!) 154/83 (BP Location: Left Arm)   Pulse 87   Temp 97.8 F (36.6 C) (Oral)   Resp 16   SpO2 98%   Visual Acuity Right Eye Distance:   Left Eye Distance:   Bilateral Distance:    Right Eye Near:   Left Eye Near:    Bilateral Near:     Physical Exam Vitals and nursing note reviewed.  Constitutional:      Appearance: She is not ill-appearing or toxic-appearing.  HENT:     Head: Normocephalic and atraumatic.     Right Ear: Hearing and external ear normal.     Left Ear: Hearing and external ear normal.     Nose: Nose normal.     Mouth/Throat:     Lips: Pink.  Eyes:     General: Lids are normal. Vision grossly intact. Gaze aligned appropriately.     Extraocular Movements: Extraocular movements intact.     Conjunctiva/sclera: Conjunctivae normal.  Pulmonary:     Effort: Pulmonary effort is normal.  Musculoskeletal:     Cervical back: Neck supple.  Skin:    General: Skin is warm and dry.     Capillary Refill: Capillary refill takes less than 2 seconds.     Findings: Abscess (Abscess of the right axilla: 1 to 2 cm incision with packing wick visible.  Purulent drainage.) present. No rash.  Neurological:     General: No focal deficit present.     Mental  Status: She is alert and  oriented to person, place, and time. Mental status is at baseline.     Cranial Nerves: No dysarthria or facial asymmetry.  Psychiatric:        Mood and Affect: Mood normal.        Speech: Speech normal.        Behavior: Behavior normal.        Thought Content: Thought content normal.        Judgment: Judgment normal.      UC Treatments / Results  Labs (all labs ordered are listed, but only abnormal results are displayed) Labs Reviewed - No data to display  EKG   Radiology No results found.  Procedures Procedures (including critical care time)  Medications Ordered in UC Medications - No data to display  Initial Impression / Assessment and Plan / UC Course  I have reviewed the triage vital signs and the nursing notes.  Pertinent labs & imaging results that were available during my care of the patient were reviewed by me and considered in my medical decision making (see chart for details).   1.  Abscess of right axilla, abscess packing removal Packing material removed with hemostat successfully.  Abscess remains open with scant purulent/bloody material. Continue taking Bactrim as prescribed. Patient given information for [facility] general surgery for follow-up and surgical consultation to remove cyst. Discussed infection return precautions. No red flags today.  Counseled patient on potential for adverse effects with medications prescribed/recommended today, strict ER and return-to-clinic precautions discussed, patient verbalized understanding.    Final Clinical Impressions(s) / UC Diagnoses   Final diagnoses:  Abscess of right axilla  Abscess packing removal   Discharge Instructions   None    ED Prescriptions   None    PDMP not reviewed this encounter.   Enedelia Dorna HERO, [place] 03/26/24 1159

## 2024-03-27 ENCOUNTER — Other Ambulatory Visit: Payer: Self-pay | Admitting: Neurology

## 2024-03-27 ENCOUNTER — Other Ambulatory Visit (HOSPITAL_COMMUNITY): Payer: Self-pay

## 2024-03-27 MED ORDER — EMGALITY 120 MG/ML ~~LOC~~ SOAJ: 120.0000 mg | SUBCUTANEOUS | 11 refills | Status: AC

## 2024-03-27 NOTE — Telephone Encounter (Signed)
    Pharmacy Patient Advocate Encounter   Received notification from Physician's Office that prior authorization for Ajovy  is required/requested.   Insurance verification completed.   The patient is insured through [employer].   Per test claim:  Emgality is preferred by the insurance.  If suggested medication is appropriate, Please send in a new RX and discontinue this one. If not, please advise as to why it's not appropriate so that we may request a Prior Authorization. Please note, some preferred medications may still require a PA.  If the suggested medications have not been trialed and there are no contraindications to their use, the PA will not be submitted, as it will not be approved.

## 2024-03-27 NOTE — Telephone Encounter (Signed)
 Ajovy  discontinued and Emgality sent to pharmacy. Thanks

## 2024-03-28 ENCOUNTER — Other Ambulatory Visit: Payer: Self-pay | Admitting: Neurology

## 2024-03-28 ENCOUNTER — Other Ambulatory Visit (HOSPITAL_COMMUNITY): Payer: Self-pay

## 2024-03-28 MED ORDER — EMGALITY 120 MG/ML ~~LOC~~ SOAJ: SUBCUTANEOUS | 0 refills | Status: DC

## 2024-03-28 NOTE — Telephone Encounter (Signed)
 Spoke with patient and updated her that Emgality has been prescribed for her to take every 30 days because Ajovy  is not preferred by her insurance. Discussed most common s/e of injection site reaction, possible increase in BP. Pt will let us  know of any concerns. May need PA. Regarding her neuropathy dx, she will try to see pcp sooner and she will also ask the urgent care doctor who has been seeing her to send a referral here. Pt states she has numbness in the bottom of both feet, started in L(?) foot and is now in the other too. She thanked me for the call and her questions were answered.

## 2024-03-28 NOTE — Telephone Encounter (Signed)
 I see pT never got the Ajovy  back when PT was first prescribed it and I did not see a loading dose-will PT need the loading dose?

## 2024-03-28 NOTE — Telephone Encounter (Signed)
 Done. Thanks.

## 2024-03-29 ENCOUNTER — Other Ambulatory Visit (HOSPITAL_COMMUNITY): Payer: Self-pay

## 2024-03-29 ENCOUNTER — Other Ambulatory Visit: Payer: Self-pay | Admitting: *Deleted

## 2024-03-29 NOTE — Telephone Encounter (Addendum)
 Pharmacy Patient Advocate Encounter   Received notification from Physician's Office that prior authorization for Emgality 120mg /ml autoinjector is required/requested.   Insurance verification completed.   The patient is insured through [employer].   Per test claim: Refill too soon. PA is not needed at this time. Medication was filled 03/27/2024. Next eligible fill date is 04/02/2024.

## 2024-03-29 NOTE — Telephone Encounter (Signed)
 Spoke with pharmacy. They will reverse claim for 1 Emgality pen and fill the 2 pens for loading dose. Pharmacy staff understands next month and going forward, patient will fill 1 injection for maintenance.

## 2024-03-29 NOTE — Telephone Encounter (Signed)
 I called patient. She has not filled Emgality yet but she said it is ready for pickup, however its only 1 injection. Patient has been made aware there is a loading dose of 2 pens we would like her to take, followed by 1 pen every 30 days thereafter. I will call the pharmacy when they open and ask them to reverse claim and fill the 2 pen Rx instead.

## 2024-04-13 ENCOUNTER — Encounter: Payer: Self-pay | Admitting: Adult Health

## 2024-04-16 MED ORDER — EMGALITY 120 MG/ML ~~LOC~~ SOAJ: SUBCUTANEOUS | 3 refills | Status: AC

## 2024-04-16 NOTE — Telephone Encounter (Signed)
 I called pt after speaking to pharmacy ([facility]).  [facility] able to [facility] dose for emaglity for pt and it did go thru.  But pt would like to get at [facility] for lower cost and so redid for 90 day supply.  She will cancel at [facility].  She understands plan.  Will continue taking maintenance dose as her first dose is 03/27/2024.

## 2024-05-25 ENCOUNTER — Ambulatory Visit (HOSPITAL_COMMUNITY)

## 2024-06-18 ENCOUNTER — Ambulatory Visit: Admitting: Adult Health

## 2024-06-26 ENCOUNTER — Ambulatory Visit: Admitting: Adult Health

## 2024-06-26 ENCOUNTER — Encounter: Payer: Self-pay | Admitting: Adult Health

## 2024-06-26 VITALS — BP 146/72 | HR 73 | Ht 64.0 in | Wt 205.2 lb

## 2024-06-26 DIAGNOSIS — G43109 Migraine with aura, not intractable, without status migrainosus: Secondary | ICD-10-CM

## 2024-06-26 DIAGNOSIS — G43711 Chronic migraine without aura, intractable, with status migrainosus: Secondary | ICD-10-CM

## 2024-06-26 MED ORDER — QULIPTA 60 MG PO TABS: 60.0000 mg | ORAL_TABLET | Freq: Every evening | ORAL | 11 refills | Status: AC

## 2024-06-26 MED ORDER — SUMATRIPTAN SUCCINATE 100 MG PO TABS: 100.0000 mg | ORAL_TABLET | Freq: Once | ORAL | 2 refills | Status: AC | PRN

## 2024-06-26 MED ORDER — KETOROLAC TROMETHAMINE 30 MG/ML IJ SOLN
30.0000 mg | Freq: Once | INTRAMUSCULAR | Status: AC
  Administered 2024-06-26: 30 mg via INTRAMUSCULAR

## 2024-06-26 NOTE — Progress Notes (Signed)
 " [facility] [address] [place]. [place] [address] [phone]       OFFICE FOLLOW UP NOTE  Annette Dunn Date of Birth:  1966-01-11 Medical Record Number:  [id number]     Reason for visit: chronic migraines    SUBJECTIVE:  CHIEF COMPLAINT:  Chief Complaint  Patient presents with   Follow-up    Patient in room 8 alone.  Patient is here for migraine follow up, in the past month patient states she's had at least 10 days with a migraine, patient stated that with that, 1 day was a headache. Patient stated she had a migraine currently.     Follow-up visit:  Prior visit: 12/06/2023 with Dr. Ines (initial consult visit)  Brief HPI:   Annette Dunn is a 59 y.o. female with PMH of HTN, HLD, DM, thyroid  nodule, chronic low back pain, chronic headaches with history of migraines, recurrent sinus infections, BPPV, vitamin D  deficiency, and obesity who was evaluated by Dr. Ines in 11/2023 for worsening of chronic headaches.  At initial visit, recommended completing MRI brain as well as initiated Ajovy  for prevention and rizatriptan  for rescue.    Interval history:  Patient returns for follow-up visit.  After prior visit, she was switched from Ajovy  to Emgality  due to insurance requirements. She completed 3 injection of Emgality  but would experience dizziness sensation 2-3 days after injection, would only last several seconds but severe enough she would have to stop what she was doing. Last injection in November, no recurrence since.  She also noted elevated blood pressure after doing injections.  She only noticed marginal improvement of migraines on Emgality .  She is currently experiencing about 10 migraines per month but can last from 1 to 3 days.  She is currently experiencing a migraine behind her right eye, feels pressure sensation. Unable to tolerate rizatriptan  as she felt a fizzy sensation/numbness in her nasal passages/sinuses after taking.  She routinely  follows with ophthalmology without any recent concerning findings per patient.      Headache History: Onset: 2013 Aura: Occasional, tunnel vision, peripheral visual impairment Location: Frontal, right side, behind eyes, occipital Quality/Description: Throbbing, pressure Associated Symptoms:             Photophobia: Yes             Phonophobia: Yes             Nausea: when severe Worse with activity?: yes    Headache days per month: 10-30    Current Treatment: Abortive none   Preventative none   Prior Therapies                                 Ibuprofen Excedrin Aleve Rizatriptan   Amlodipine  Metoprolol  Gabapentin Lexapro Amitriptyline Topiramate contraindicated d/t hx of kidney stones Emgality  - dizziness, elevated BP             ROS:   14 system review of systems performed and negative with exception of those listed in HPI  PMH:  Past Medical History:  Diagnosis Date   Acute sinusitis    Allergic rhinitis    Back pain    BMI 35.0-35.9,adult    Cyst of nasal sinus    Deviated nasal septum    Drug-induced GI disturbance    Hidradenitis    Hypertension    Impaired fasting glucose    Muscle spasm of back    PONV (  postoperative nausea and vomiting)    Sore throat    Strep pharyngitis    SVT (supraventricular tachycardia)    Vitamin D  deficiency     PSH:  Past Surgical History:  Procedure Laterality Date   ABDOMINAL HYSTERECTOMY     NASAL SEPTOPLASTY W/ TURBINOPLASTY Bilateral 07/28/2022   Procedure: NASAL SEPTOPLASTY WITH TURBINATE REDUCTION;  Surgeon: Luciano Standing, MD;  Location: [facility];  Service: ENT;  Laterality: Bilateral;    Social History:  Social History   Socioeconomic History   Marital status: Married    Spouse name: Not on file   Number of children: Not on file   Years of education: Not on file   Highest education level: Not on file  Occupational History   Not on file  Tobacco Use   Smoking status:  Never    Passive exposure: Past   Smokeless tobacco: Never  Vaping Use   Vaping status: Never Used  Substance and Sexual Activity   Alcohol use: No   Drug use: Never   Sexual activity: Not on file  Other Topics Concern   Not on file  Social History Narrative   Pt lives with family    Pt not working    Social Drivers of Health   Tobacco Use: Low Risk (06/26/2024)   Patient History    Smoking Tobacco Use: Never    Smokeless Tobacco Use: Never    Passive Exposure: Past  Financial Resource Strain: Low Risk  (04/09/2024)   [facility]   Overall Financial Resource Strain (CARDIA)    Difficulty of Paying Living Expenses: Not hard at all  Food Insecurity: No Food Insecurity (04/09/2024)   Received from [facility]   Epic    Within the past 12 months, you worried that your food would run out before you got the money to buy more.: Never true    Within the past 12 months, the food you bought just didn't last and you didn't have money to get more.: Never true  Transportation Needs: No Transportation Needs (04/09/2024)   Received from [facility] - Transportation    In the past 12 months, has lack of transportation kept you from medical appointments or from getting medications?: No    Lack of Transportation (Non-Medical): No  Physical Activity: Not on file  Stress: Not on file  Social Connections: Not on file  Intimate Partner Violence: Not on file  Depression (EYV7-0): Not on file  Alcohol Screen: Not on file  Housing: Low Risk  (04/09/2024)   Received from [facility]   Epic    In the last 12 months, was there a time when you were not able to pay the mortgage or rent on time?: No    In the past 12 months, how many times have you moved where you were living?: 0    At any time in the past 12 months, were you homeless or living in a shelter (including now)?: No  Utilities: Not At Risk  (04/09/2024)   Received from [facility]   Epic    In the past 12 months has the electric, gas, oil, or water company threatened to shut off services in your home?: No  Health Literacy: Not on file    Family History:  Family History  Problem Relation Age of Onset   Thyroid  disease Mother    Hypertension Mother    Thyroid   disease Father    Hypertension Father    Prostate cancer Father 68 - 48   Prostate cancer Maternal Uncle        metastatic   Cancer Maternal Uncle    Cancer Maternal Uncle    Prostate cancer Maternal Grandfather    Breast cancer Cousin        dx. >50   Prostate cancer Cousin    Migraines Neg Hx    Headache Neg Hx     Medications:  Medications Ordered Prior to Encounter[1]  Allergies:  Allergies[2]    OBJECTIVE:  Physical Exam  Vitals:   06/26/24 1303  BP: (!) 146/72  Pulse: 73  Weight: 205 lb 3.2 oz (93.1 kg)  Height: 5' 4 (1.626 m)   Body mass index is 35.22 kg/m. No results found.  General: well developed, well nourished, very pleasant middle-age female, seated, in no evident distress  Neurologic Exam Mental Status: Awake and fully alert. Oriented to place and time. Recent and remote memory intact. Attention span, concentration and fund of knowledge appropriate. Mood and affect appropriate.  Cranial Nerves: Extraocular movements full without nystagmus. Visual fields full to confrontation. Hearing intact. Facial sensation intact. Face, tongue, palate moves normally and symmetrically.  Motor: Normal bulk and tone. Normal strength in all tested extremity muscles Gait and Station: Arises from chair without difficulty. Stance is normal. Gait demonstrates normal stride length and balance          ASSESSMENT/PLAN: Annette Dunn is a 59 y.o. year old female      Chronic migraine headaches :  Prevention: start Qulipta  60 mg nightly.  Hesitant to trial other injectables CGRP's due to longer half life with Emgality   causing elevated blood pressure. Advised to monitor blood pressure and discontinue if reoccurs on Qulipta .  Rescue: start sumatriptan  100 mg as needed, can repeat after 2 hrs if needed. Max dose 200mg /24 hrs. Advised to limit use to no more than 2-3x per week to reduce rebound headaches Provided Toradol  30mg  IM today to break current migraine cycle. Denies any prior intolerance to NSAIDs, denies kidney disease.  Discussed continued conservative measures and nonpharmacological measures at home MRI brain 12/2023 nonspecific white matter disease likely due to chronic headaches     Follow up in 6 months or call earlier if needed   CC:  PCP: Lonna Millman, DO    I personally spent a total of 30 minutes in the care of the patient today including preparing to see the patient, getting/reviewing separately obtained history, performing a medically appropriate exam/evaluation, counseling and educating, placing orders, and documenting clinical information in the EHR.  Harlene Bogaert, AGNP-BC  [facility] [address] [place], [place] [address]  Phone [phone] Fax [phone] Note: This document was prepared with digital dictation and possible smart phrase technology. Any transcriptional errors that result from this process are unintentional.     [1]  Current Outpatient Medications on File Prior to Visit  Medication Sig Dispense Refill   amLODipine  (NORVASC ) 5 MG tablet TAKE 1 TABLET (5 MG TOTAL) BY MOUTH DAILY. 90 tablet 0   chlorthalidone (HYGROTON) 25 MG tablet Take 12.5 mg by mouth daily.     clindamycin  (CLEOCIN  T) 1 % SWAB Apply topically daily.     fluticasone (FLONASE) 50 MCG/ACT nasal spray Place 2 sprays into both nostrils daily.     meloxicam (MOBIC) 15 MG tablet Take 15 mg by mouth daily as needed.     montelukast (SINGULAIR) 10 MG  tablet daily as needed.     Multiple Vitamin (MULTIVITAMIN) tablet Take 1 tablet by mouth daily.      Galcanezumab -gnlm (EMGALITY ) 120 MG/ML SOAJ Inject into skin every 30 days. (Patient not taking: Reported on 06/26/2024) 3.36 mL 3   rizatriptan  (MAXALT -MLT) 10 MG disintegrating tablet Take 1 tablet (10 mg total) by mouth as needed for migraine. May repeat in 2 hours if needed (Patient not taking: Reported on 06/26/2024) 12 tablet 11   No current facility-administered medications on file prior to visit.  [2]  Allergies Allergen Reactions   Gadolinium Derivatives Other (See Comments)    Rash, itching   Doxycycline Rash    Other reaction(s): rash/itching   "

## 2024-06-26 NOTE — Progress Notes (Signed)
 Orders given from NP for Ketorolac  30mg    Pt verified by name and DOB   Explain procedure to pt,   ketorolac  60mg  given in the L gluteal muscle    Pt advised to wait 15 min's after injection    Pt tolerated well   NDC 72266-119-01 LOT: [id number] EXP 05/2026

## 2024-06-26 NOTE — Patient Instructions (Addendum)
 Your Plan:  Start Qulipta  60mg  nightly for migraine prevention  Start Imitrex  as needed at onset of migraine; can repeat dose after 2 hours if needed. Max dose 2 tablets in 24 hours. Limit to no more than 2-3 times per week or 8 times per month to prevent rebound headaches   You received a Toradol  injection today to hopefully stop current migraine  If above medications do not work or you have any difficulty tolerating, please let me know!     Follow up in 6 months or call earlier if needed      Thank you for coming to see us  at [facility]. I hope we have been able to provide you high quality care today.  You may receive a patient satisfaction survey over the next few weeks. We would appreciate your feedback and comments so that we may continue to improve ourselves and the health of our patients.

## 2024-07-02 ENCOUNTER — Encounter (HOSPITAL_COMMUNITY): Payer: Self-pay

## 2024-07-02 ENCOUNTER — Ambulatory Visit (HOSPITAL_COMMUNITY): Attending: Physical Medicine and Rehabilitation

## 2024-07-02 ENCOUNTER — Other Ambulatory Visit: Payer: Self-pay

## 2024-07-02 DIAGNOSIS — R29898 Other symptoms and signs involving the musculoskeletal system: Secondary | ICD-10-CM | POA: Insufficient documentation

## 2024-07-02 DIAGNOSIS — M5459 Other low back pain: Secondary | ICD-10-CM | POA: Diagnosis present

## 2024-07-02 DIAGNOSIS — Z7409 Other reduced mobility: Secondary | ICD-10-CM | POA: Diagnosis present

## 2024-07-02 NOTE — Addendum Note (Signed)
 Addended by: Treyson Axel on: 07/02/2024 12:36 PM   Modules accepted: Orders

## 2024-07-02 NOTE — Therapy (Addendum)
 " OUTPATIENT PHYSICAL THERAPY THORACOLUMBAR EVALUATION   Patient Name: Annette Dunn MRN: [id number] DOB:09/29/1965, 59 y.o., female Today's Date: 07/02/2024  END OF SESSION:  PT End of Session - 07/02/24 1024     Visit Number 1    Date for Recertification  08/20/24    Authorization Type [employer]    Authorization Time Period no auth required    Progress Note Due on Visit 10    PT Start Time 0935    PT Stop Time 1018    PT Time Calculation (min) 43 min    Activity Tolerance Patient tolerated treatment well;Patient limited by pain    Behavior During Therapy WFL for tasks assessed/performed          Past Medical History:  Diagnosis Date   Acute sinusitis    Allergic rhinitis    Back pain    BMI 35.0-35.9,adult    Cyst of nasal sinus    Deviated nasal septum    Drug-induced GI disturbance    Hidradenitis    Hypertension    Impaired fasting glucose    Muscle spasm of back    PONV (postoperative nausea and vomiting)    Sore throat    Strep pharyngitis    SVT (supraventricular tachycardia)    Vitamin D  deficiency    Past Surgical History:  Procedure Laterality Date   ABDOMINAL HYSTERECTOMY     NASAL SEPTOPLASTY W/ TURBINOPLASTY Bilateral 07/28/2022   Procedure: NASAL SEPTOPLASTY WITH TURBINATE REDUCTION;  Surgeon: Luciano Standing, MD;  Location: [facility];  Service: ENT;  Laterality: Bilateral;   Patient Active Problem List   Diagnosis Date Noted   Genetic testing 02/14/2024   Chronic migraine without aura, with intractable migraine, so stated, with status migrainosus 12/06/2023   Migraine with aura and without status migrainosus, not intractable 12/06/2023   MSH6 gene mutation positive 06/14/2023   Essential (primary) hypertension 01/12/2023   Palpitations 01/12/2023   Elevated coronary artery calcium  score 01/12/2023   Prediabetes 01/12/2023    PCP: Lonna Millman, DO   REFERRING PROVIDER: Arminda Das, MD  REFERRING DIAG: [phone] (ICD-10-CM)  - Spondylosis without myelopathy or radiculopathy, lumbar region  Rationale for Evaluation and Treatment: Rehabilitation  THERAPY DIAG:  Other low back pain  Impaired functional mobility, balance, gait, and endurance  Decreased strength of lower extremity  ONSET DATE: 2000  SUBJECTIVE:                                                                                                                                                                                           SUBJECTIVE STATEMENT: Pt states she has been  dealing with this for years, since 2000. Pt states she was having increased pain with running so she stopped but now is hurting more and more. Pt states she has increased pain and needs increased time for ADLs. Pt states she also has neuropathy in both feet. Pt states she is trying to lose weight and would like to be able to exercise more.   PERTINENT HISTORY:  On weight loss medicine Migraines Has had cortisone shots in 2018-2019 with minimal relieve HTN, medicated Heart flutters Goiter on thyroid   PAIN:  Are you having pain? Yes: NPRS scale: 3/10, at worst in last week 5/10 Pain location: low back, right and left Pain description: constant, dull pain Aggravating factors: bending, squatting, lifting, prolonged walking bothers it Relieving factors: medications, heat  PRECAUTIONS: None  RED FLAGS: None   WEIGHT BEARING RESTRICTIONS: No  FALLS:  Has patient fallen in last 6 months? Yes. 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  DIAGNOSTIC FINDINGS:  EXAM DESCRIPTION: MR BRAIN W WO CONTRAST   CLINICAL HISTORY: Headache, increasing frequency or severity   COMPARISON: None available   TECHNIQUE: Non contrast MRI of the brain is performed according to our usual protocol including multiplanar multi sequence technique.   FINDINGS: No acute intracranial hemorrhage, mass, edema, or hydrocephalus. No areas of abnormal enhancement. No significant cortical atrophy or encephalomalacia. Moderate periventricular and subcortical white matter disease which is nonspecific. No associated enhancement. The vascular flow voids are unremarkable. No significant sinus disease.   IMPRESSION: Moderate nonspecific white matter disease. Findings could reflect chronic benign ischemic demyelination. This can also be seen with headaches/migraines. I feel an autoimmune demyelinating process would be less likely given the location and morphology of disease.  PATIENT SURVEYS:  Modified Oswestry: 24 / 50 = 48.0 %      SENSATION: Light touch: Impaired , neuropathy started in October   POSTURE: rounded shoulders, forward head, and flexed trunk   PALPATION: Increased tenderness noted L2-L5 with CPA and UPA, increased tenderness on R side, tenderness also noted to bilateral greater trochanters (R worse) and same with sciatic region bilaterally.  LUMBAR ROM:   AROM eval  Flexion 33, more pain  Extension 15, little pain  Right lateral flexion 15, slight pain in hip  Left lateral flexion 10, worse pain in hip  Right rotation WFL  Left rotation WFL   (Blank rows = not tested)  LOWER EXTREMITY ROM:     Active  Right eval Left eval  Hip flexion    Hip extension    Hip abduction    Hip adduction    Hip internal rotation    Hip external rotation    Knee flexion    Knee extension    Ankle dorsiflexion    Ankle  plantarflexion    Ankle inversion    Ankle eversion     (Blank rows = not tested)  LOWER EXTREMITY MMT:    MMT Right eval Left eval  Hip flexion 3 3  Hip extension 3- 3+  Hip abduction 3 3+  Hip adduction    Hip internal rotation    Hip external rotation    Knee flexion    Knee extension    Ankle dorsiflexion    Ankle plantarflexion    Ankle inversion    Ankle eversion     (Blank rows = not tested)  LUMBAR SPECIAL TESTS:  Straight leg raise test: Negative  FUNCTIONAL TESTS:  5 times sit to stand: 13.26 seconds, knee pain 2 minute walk test: 355 feet, no AD  GAIT: Distance walked: 440 Assistive device utilized: None Level of assistance: Complete Independence Comments: decreased performance of RLE compared to the LLE, decreased stride length and trunk lean to the right noted  TREATMENT DATE:  07/02/2024   Evaluation: -ROM measured, Strength assessed, HEP prescribed, pt educated on prognosis, findings, and importance of HEP compliance if given.                                                                                                                                      PATIENT EDUCATION:  Education details: Pt was educated on findings of PT evaluation, prognosis, frequency of therapy visits and rationale, attendance policy, and HEP if given.   Person educated: Patient Education method: Explanation and Handouts Education comprehension: verbalized understanding and needs further education  HOME EXERCISE PROGRAM: Access Code: [email or web address] URL: [email or web address] Date: 07/02/2024 Prepared by: Lang Ada  Exercises - Clamshell  - 1 x daily - 7 x weekly - 3 sets - 10 reps - Supine Bridge  - 1 x daily - 7 x weekly - 3 sets - 10 reps - 3 hold - Supine Lower Trunk Rotation  - 1 x daily - 7 x weekly - 3 sets - 10 reps - Supine Posterior Pelvic Tilt  - 1 x daily - 7 x weekly - 3 sets - 10 reps - 3 hold  ASSESSMENT:  CLINICAL  IMPRESSION: Patient is a 59 y.o. female who was seen today for physical therapy evaluation and treatment for M47.816 (ICD-10-CM) - Spondylosis without myelopathy or radiculopathy, lumbar region.   Patient demonstrates increased low back pain, decreased LE/core strength, abnormal pain with palpation of low back segments and bilateral hips, and impaired functional mobility. Patient also demonstrates abnormal gait with decreased stride length of RLE, R trunk lean and decreased speed during today's session. Patient also demonstrates RLE strength deficits compared to the left side although both have significant deficits. Patient requires education on role of PT, importance of increased physical activity, HEP compliance and overall POC. Patient would benefit from skilled physical therapy for decreased low back pain, increased endurance with ambulation, increased LE/core strength, and balance for improved gait quality, return to higher level of function with ADLs, and progress towards therapy goals.   OBJECTIVE IMPAIRMENTS: Abnormal gait, decreased activity tolerance, decreased balance, decreased endurance, decreased knowledge of condition,  decreased mobility, difficulty walking, decreased ROM, decreased strength, hypomobility, and pain.   ACTIVITY LIMITATIONS: carrying, lifting, bending, standing, squatting, stairs, transfers, and bed mobility  PARTICIPATION LIMITATIONS: meal prep, cleaning, laundry, shopping, community activity, and yard work  PERSONAL FACTORS: Age, Fitness, Past/current experiences, and Time since onset of injury/illness/exacerbation are also affecting patient's functional outcome.   REHAB POTENTIAL: Fair Chronic in nature  CLINICAL DECISION MAKING: Stable/uncomplicated  EVALUATION COMPLEXITY: Low   GOALS: Goals reviewed with patient? No  SHORT TERM GOALS: Target date: 07/23/24  Pt will be independent with HEP in order to demonstrate participation in Physical Therapy POC.   Baseline: Goal status: INITIAL  2.  Pt will report 1/10 pain with lumbar spine mobility in order to demonstrate improved pain with ADLs.  Baseline: 3/10 Goal status: INITIAL  LONG TERM GOALS: Target date: 08/13/24  Pt will improve 5TSTS time by at least 2.3 seconds in order to demonstrate improved functional strength to return to desired activities.  Baseline: see objective.  Goal status: INITIAL  2.  Pt will improve 2 MWT by at least 40 feet in order to demonstrate improved functional ambulatory capacity in community setting.  Baseline: see objective.  Goal status: INITIAL  3.  Pt will improve Modified Oswestry score by at least 6 points in order to demonstrate improved pain with functional goals and outcomes. Baseline: see objective.  Goal status: INITIAL  4.  Pt will report 1/10 pain with mobility in order to demonstrate reduced pain with ADLs lasting greater than 30 minutes.  Baseline: see objective.  Goal status: INITIAL   PLAN:  PT FREQUENCY: 1-2x/week  PT DURATION: 6 weeks  PLANNED INTERVENTIONS: 97110-Therapeutic exercises, 97530- Therapeutic activity, 97112- Neuromuscular re-education, 97535- Self Care, [id number]- Manual therapy, [phone]- Gait training, [phone]- Electrical stimulation (unattended), [phone]- Electrical stimulation (manual), [phone] (1-2 muscles), [phone] (3+ muscles)- Dry Needling, Patient/Family education, Balance training, Stair training, Joint mobilization, Joint manipulation, Spinal manipulation, Spinal mobilization, DME instructions, Cryotherapy, and Moist heat.  PLAN FOR NEXT SESSION: Review HEP and goals, SLS testing, progress core and hip strengthening/mobility, progress walking and endurance training, incorporate hip and balance activities   Lang Ada, PT, DPT [facility] Office: [phone] 12:35 PM, 07/02/2024   "

## 2024-07-04 ENCOUNTER — Ambulatory Visit (HOSPITAL_COMMUNITY)

## 2024-07-05 ENCOUNTER — Encounter (HOSPITAL_COMMUNITY): Payer: Self-pay

## 2024-07-05 ENCOUNTER — Ambulatory Visit (HOSPITAL_COMMUNITY)

## 2024-07-05 DIAGNOSIS — M5459 Other low back pain: Secondary | ICD-10-CM

## 2024-07-05 DIAGNOSIS — R29898 Other symptoms and signs involving the musculoskeletal system: Secondary | ICD-10-CM

## 2024-07-05 DIAGNOSIS — Z7409 Other reduced mobility: Secondary | ICD-10-CM

## 2024-07-05 NOTE — Therapy (Signed)
 " OUTPATIENT PHYSICAL THERAPY THORACOLUMBAR TREATMENT   Patient Name: Annette Dunn MRN: [id number] DOB:14-Feb-1966, 59 y.o., female Today's Date: 07/05/2024  END OF SESSION:  PT End of Session - 07/05/24 0900     Visit Number 2    Number of Visits 12    Date for Recertification  08/20/24    Authorization Type [employer]    Authorization Time Period no auth required    Progress Note Due on Visit 10    PT Start Time 0901    PT Stop Time 0945    PT Time Calculation (min) 44 min    Activity Tolerance Patient tolerated treatment well    Behavior During Therapy WFL for tasks assessed/performed          Past Medical History:  Diagnosis Date   Acute sinusitis    Allergic rhinitis    Back pain    BMI 35.0-35.9,adult    Cyst of nasal sinus    Deviated nasal septum    Drug-induced GI disturbance    Hidradenitis    Hypertension    Impaired fasting glucose    Muscle spasm of back    PONV (postoperative nausea and vomiting)    Sore throat    Strep pharyngitis    SVT (supraventricular tachycardia)    Vitamin D  deficiency    Past Surgical History:  Procedure Laterality Date   ABDOMINAL HYSTERECTOMY     NASAL SEPTOPLASTY W/ TURBINOPLASTY Bilateral 07/28/2022   Procedure: NASAL SEPTOPLASTY WITH TURBINATE REDUCTION;  Surgeon: Luciano Standing, MD;  Location: [facility];  Service: ENT;  Laterality: Bilateral;   Patient Active Problem List   Diagnosis Date Noted   Genetic testing 02/14/2024   Chronic migraine without aura, with intractable migraine, so stated, with status migrainosus 12/06/2023   Migraine with aura and without status migrainosus, not intractable 12/06/2023   MSH6 gene mutation positive 06/14/2023   Essential (primary) hypertension 01/12/2023   Palpitations 01/12/2023   Elevated coronary artery calcium  score 01/12/2023   Prediabetes 01/12/2023    PCP: Lonna Millman, DO   REFERRING PROVIDER: Arminda Das, MD  REFERRING DIAG: [phone] (ICD-10-CM)  - Spondylosis without myelopathy or radiculopathy, lumbar region  Rationale for Evaluation and Treatment: Rehabilitation  THERAPY DIAG:  Other low back pain  Impaired functional mobility, balance, gait, and endurance  Decreased strength of lower extremity  ONSET DATE: 2000  SUBJECTIVE:                                                                                                                                                                                           SUBJECTIVE STATEMENT: 07/05/24:  Pain scale Rt side LBP pain scale 3/10.  Has began HEP with reports of some irritartion following, no questions concerning exercise program.    Eval:  Pt states she has been dealing with this for years, since 2000. Pt states she was having increased pain with running so she stopped but now is hurting more and more. Pt states she has increased pain and needs increased time for ADLs. Pt states she also has neuropathy in both feet. Pt states she is trying to lose weight and would like to be able to exercise more.   PERTINENT HISTORY:  On weight loss medicine Migraines Has had cortisone shots in 2018-2019 with minimal relieve HTN, medicated Heart flutters Goiter on thyroid   PAIN:  Are you having pain? Yes: NPRS scale: 3/10, at worst in last week 5/10 Pain location: low back, right and left Pain description: constant, dull pain Aggravating factors: bending, squatting, lifting, prolonged walking bothers it Relieving factors: medications, heat  PRECAUTIONS: None  RED FLAGS: None   WEIGHT BEARING RESTRICTIONS: No  FALLS:  Has patient fallen in last 6 months? Yes. 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  DIAGNOSTIC FINDINGS:  EXAM DESCRIPTION: MR BRAIN W WO CONTRAST   CLINICAL HISTORY: Headache, increasing frequency or severity   COMPARISON: None available   TECHNIQUE: Non contrast MRI of the brain is performed according to our usual protocol including multiplanar multi sequence technique.   FINDINGS: No acute intracranial hemorrhage, mass, edema, or hydrocephalus. No areas of abnormal enhancement. No significant cortical atrophy or encephalomalacia. Moderate periventricular and subcortical white matter disease which is nonspecific. No associated enhancement. The vascular flow voids are unremarkable. No significant sinus disease.   IMPRESSION: Moderate nonspecific white matter disease. Findings could reflect chronic benign ischemic demyelination. This can also be seen with headaches/migraines. I feel an autoimmune demyelinating process would be less likely given the location and morphology of disease.  PATIENT SURVEYS:  Modified Oswestry: 24 / 50 = 48.0 %      SENSATION: Light touch: Impaired , neuropathy started in October   POSTURE: rounded shoulders, forward head, and flexed trunk   PALPATION: Increased tenderness noted L2-L5 with CPA and UPA, increased tenderness on R side, tenderness also noted to bilateral greater trochanters (R worse) and same with sciatic region bilaterally.  LUMBAR ROM:   AROM eval  Flexion 33, more pain  Extension 15, little pain  Right lateral flexion 15, slight pain in hip  Left lateral flexion 10, worse pain in hip  Right rotation WFL  Left rotation WFL   (Blank rows = not tested)  LOWER EXTREMITY ROM:     Active  Right eval Left eval  Hip flexion    Hip  extension    Hip abduction    Hip adduction    Hip internal rotation    Hip external rotation    Knee flexion    Knee extension    Ankle dorsiflexion  Ankle plantarflexion    Ankle inversion    Ankle eversion     (Blank rows = not tested)  LOWER EXTREMITY MMT:    MMT Right eval Left eval  Hip flexion 3 3  Hip extension 3- 3+  Hip abduction 3 3+  Hip adduction    Hip internal rotation    Hip external rotation    Knee flexion    Knee extension    Ankle dorsiflexion    Ankle plantarflexion    Ankle inversion    Ankle eversion     (Blank rows = not tested)  LUMBAR SPECIAL TESTS:  Straight leg raise test: Negative  FUNCTIONAL TESTS:  5 times sit to stand: 13.26 seconds, knee pain 2 minute walk test: 355 feet, no AD  GAIT: Distance walked: 440 Assistive device utilized: None Level of assistance: Complete Independence Comments: decreased performance of RLE compared to the LLE, decreased stride length and trunk lean to the right noted  TREATMENT DATE:  07/05/24: Reviewed goals Educated importance of HEP compliance with  SLS Lt 24 pain both hips, Rt 5  Supine: log rolling mechanics LTR 10x 10 encouraged pain free range Posterior pelvic tilt with verbal and tactile cueing 10x 3-5 Marching with TrA activation 5x 5 Bridge 10x  Sidelying: Clam with RTB 7x 5  07/02/2024   Evaluation: -ROM measured, Strength assessed, HEP prescribed, pt educated on prognosis, findings, and importance of HEP compliance if given.                                                                                                                                      PATIENT EDUCATION:  Education details: Pt was educated on findings of PT evaluation, prognosis, frequency of therapy visits and rationale, attendance policy, and HEP if given.   Person educated: Patient Education method: Explanation and Handouts Education comprehension: verbalized understanding and needs further  education  HOME EXERCISE PROGRAM: Access Code: [email or web address] URL: [email or web address] Date: 07/02/2024 Prepared by: Lang Ada  Exercises - Clamshell  - 1 x daily - 7 x weekly - 3 sets - 10 reps - Supine Bridge  - 1 x daily - 7 x weekly - 3 sets - 10 reps - 3 hold - Supine Lower Trunk Rotation  - 1 x daily - 7 x weekly - 3 sets - 10 reps - Supine Posterior Pelvic Tilt  - 1 x daily - 7 x weekly - 3 sets - 10 reps - 3 hold  ASSESSMENT:  CLINICAL IMPRESSION: 07/05/24:  Reviewed goals and educated importance of HEP compliance for maximal benefits with therapy.  Pt able to recall and demonstrate appropriate mechanics with current exercise program, encouraged to complete LTR in pain free range.  Pt educated on proper mechanics with bed mobility, did experience some vertigo symptoms with sitting to sidelying and during rotation to other side.  Pt feels her sinus may play a part of the  dizziness today.  Session focus on core and proximal strengthening.  Verbal and tactile cueing to improve TrA activation and reports of ability to complete exercises in pain free range.  Pt did point to PSIS near end of session reporting usual spots of pain, further investigation of SI alignment may be beneficial in future apts.  Pt reports difficulty with vision following dizziness and reports of pain Lt side of neck, required to sit for several minutes until vision improved.  Encouraged to contact MD concerning.  Eval:  Patient is a 59 y.o. female who was seen today for physical therapy evaluation and treatment for M47.816 (ICD-10-CM) - Spondylosis without myelopathy or radiculopathy, lumbar region.   Patient demonstrates increased low back pain, decreased LE/core strength, abnormal pain with palpation of low back segments and bilateral hips, and impaired functional mobility. Patient also demonstrates abnormal gait with decreased stride length of RLE, R trunk lean and decreased speed during today's session.  Patient also demonstrates RLE strength deficits compared to the left side although both have significant deficits. Patient requires education on role of PT, importance of increased physical activity, HEP compliance and overall POC. Patient would benefit from skilled physical therapy for decreased low back pain, increased endurance with ambulation, increased LE/core strength, and balance for improved gait quality, return to higher level of function with ADLs, and progress towards therapy goals.   OBJECTIVE IMPAIRMENTS: Abnormal gait, decreased activity tolerance, decreased balance, decreased endurance, decreased knowledge of condition, decreased mobility, difficulty walking, decreased ROM, decreased strength, hypomobility, and pain.   ACTIVITY LIMITATIONS: carrying, lifting, bending, standing, squatting, stairs, transfers, and bed mobility  PARTICIPATION LIMITATIONS: meal prep, cleaning, laundry, shopping, community activity, and yard work  PERSONAL FACTORS: Age, Fitness, Past/current experiences, and Time since onset of injury/illness/exacerbation are also affecting patient's functional outcome.   REHAB POTENTIAL: Fair Chronic in nature  CLINICAL DECISION MAKING: Stable/uncomplicated  EVALUATION COMPLEXITY: Low   GOALS: Goals reviewed with patient? No  SHORT TERM GOALS: Target date: 07/23/24  Pt will be independent with HEP in order to demonstrate participation in Physical Therapy POC.  Baseline: Goal status: INITIAL  2.  Pt will report 1/10 pain with lumbar spine mobility in order to demonstrate improved pain with ADLs.  Baseline: 3/10 Goal status: INITIAL  LONG TERM GOALS: Target date: 08/13/24  Pt will improve 5TSTS time by at least 2.3 seconds in order to demonstrate improved functional strength to return to desired activities.  Baseline: see objective.  Goal status: INITIAL  2.  Pt will improve 2 MWT by at least 40 feet in order to demonstrate improved functional ambulatory  capacity in community setting.  Baseline: see objective.  Goal status: INITIAL  3.  Pt will improve Modified Oswestry score by at least 6 points in order to demonstrate improved pain with functional goals and outcomes. Baseline: see objective.  Goal status: INITIAL  4.  Pt will report 1/10 pain with mobility in order to demonstrate reduced pain with ADLs lasting greater than 30 minutes.  Baseline: see objective.  Goal status: INITIAL   PLAN:  PT FREQUENCY: 1-2x/week  PT DURATION: 6 weeks  PLANNED INTERVENTIONS: 97110-Therapeutic exercises, 97530- Therapeutic activity, 97112- Neuromuscular re-education, 97535- Self Care, [id number]- Manual therapy, [phone]- Gait training, [phone]- Electrical stimulation (unattended), [phone]- Electrical stimulation (manual), [phone] (1-2 muscles), [phone] (3+ muscles)- Dry Needling, Patient/Family education, Balance training, Stair training, Joint mobilization, Joint manipulation, Spinal manipulation, Spinal mobilization, DME instructions, Cryotherapy, and Moist heat.  PLAN FOR NEXT SESSION: progress core and hip strengthening/mobility,  progress walking and endurance training, incorporate hip and balance activities  Check SI with MET PRN.  Augustin Mclean, LPTA/CLT; CBIS [phone]  4:16 PM, 07/05/24   "

## 2024-07-09 ENCOUNTER — Ambulatory Visit (HOSPITAL_COMMUNITY): Admitting: Physical Therapy

## 2024-07-09 DIAGNOSIS — Z7409 Other reduced mobility: Secondary | ICD-10-CM

## 2024-07-09 DIAGNOSIS — R29898 Other symptoms and signs involving the musculoskeletal system: Secondary | ICD-10-CM

## 2024-07-09 DIAGNOSIS — M5459 Other low back pain: Secondary | ICD-10-CM | POA: Diagnosis not present

## 2024-07-09 NOTE — Therapy (Signed)
 " OUTPATIENT PHYSICAL THERAPY THORACOLUMBAR TREATMENT   Patient Name: Annette Dunn MRN: [id number] DOB:February 02, 1966, 59 y.o., female Today's Date: 07/09/2024  END OF SESSION:    Past Medical History:  Diagnosis Date   Acute sinusitis    Allergic rhinitis    Back pain    BMI 35.0-35.9,adult    Cyst of nasal sinus    Deviated nasal septum    Drug-induced GI disturbance    Hidradenitis    Hypertension    Impaired fasting glucose    Muscle spasm of back    PONV (postoperative nausea and vomiting)    Sore throat    Strep pharyngitis    SVT (supraventricular tachycardia)    Vitamin D  deficiency    Past Surgical History:  Procedure Laterality Date   ABDOMINAL HYSTERECTOMY     NASAL SEPTOPLASTY W/ TURBINOPLASTY Bilateral 07/28/2022   Procedure: NASAL SEPTOPLASTY WITH TURBINATE REDUCTION;  Surgeon: Luciano Standing, MD;  Location: [facility];  Service: ENT;  Laterality: Bilateral;   Patient Active Problem List   Diagnosis Date Noted   Genetic testing 02/14/2024   Chronic migraine without aura, with intractable migraine, so stated, with status migrainosus 12/06/2023   Migraine with aura and without status migrainosus, not intractable 12/06/2023   MSH6 gene mutation positive 06/14/2023   Essential (primary) hypertension 01/12/2023   Palpitations 01/12/2023   Elevated coronary artery calcium  score 01/12/2023   Prediabetes 01/12/2023    PCP: Lonna Millman, DO   REFERRING PROVIDER: Arminda Das, MD  REFERRING DIAG: [phone] (ICD-10-CM) - Spondylosis without myelopathy or radiculopathy, lumbar region  Rationale for Evaluation and Treatment: Rehabilitation  THERAPY DIAG:  Other low back pain  Impaired functional mobility, balance, gait, and endurance  Decreased strength of lower extremity  ONSET DATE: 2000  SUBJECTIVE:                                                                                                                                                                                            SUBJECTIVE STATEMENT: 07/09/24:  Pt reports she is a little sore from the exercises but otherwise pain is about the same on Rt side at 3/10.  Reports her vertigo comes and goes and may have to get treatment if does not improve.     Eval:  Pt states she has been dealing with this for years, since 2000. Pt states she was having increased pain with running so she stopped but now is hurting more and more. Pt states she has increased pain and needs increased time for ADLs. Pt states she also has neuropathy in both feet. Pt states she is trying  to lose weight and would like to be able to exercise more.   PERTINENT HISTORY:  On weight loss medicine Migraines Has had cortisone shots in 2018-2019 with minimal relieve HTN, medicated Heart flutters Goiter on thyroid   PAIN:  Are you having pain? Yes: NPRS scale: 3/10, at worst in last week 5/10 Pain location: low back, right and left Pain description: constant, dull pain Aggravating factors: bending, squatting, lifting, prolonged walking bothers it Relieving factors: medications, heat  PRECAUTIONS: None  RED FLAGS: None   WEIGHT BEARING RESTRICTIONS: No  FALLS:  Has patient fallen in last 6 months? Yes. 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  DIAGNOSTIC FINDINGS:  EXAM DESCRIPTION: MR BRAIN W WO CONTRAST   CLINICAL HISTORY: Headache, increasing  frequency or severity   COMPARISON: None available   TECHNIQUE: Non contrast MRI of the brain is performed according to our usual protocol including multiplanar multi sequence technique.   FINDINGS: No acute intracranial hemorrhage, mass, edema, or hydrocephalus. No areas of abnormal enhancement. No significant cortical atrophy or encephalomalacia. Moderate periventricular and subcortical white matter disease which is nonspecific. No associated enhancement. The vascular flow voids are unremarkable. No significant sinus disease.   IMPRESSION: Moderate nonspecific white matter disease. Findings could reflect chronic benign ischemic demyelination. This can also be seen with headaches/migraines. I feel an autoimmune demyelinating process would be less likely given the location and morphology of disease.  PATIENT SURVEYS:  Modified Oswestry: 24 / 50 = 48.0 %      SENSATION: Light touch: Impaired , neuropathy started in October   POSTURE: rounded shoulders, forward head, and flexed trunk   PALPATION: Increased tenderness noted L2-L5 with CPA and UPA, increased tenderness on R side, tenderness also noted to bilateral greater trochanters (R worse) and same with sciatic region bilaterally.  LUMBAR ROM:   AROM eval  Flexion 33, more pain  Extension 15, little pain  Right lateral flexion 15, slight pain in hip  Left lateral flexion 10, worse pain in hip  Right rotation WFL  Left rotation WFL   (Blank rows = not tested)  LOWER EXTREMITY ROM:     Active  Right eval Left eval  Hip flexion    Hip extension    Hip abduction    Hip adduction    Hip internal rotation    Hip external rotation    Knee flexion    Knee extension    Ankle dorsiflexion    Ankle plantarflexion    Ankle inversion    Ankle eversion     (Blank rows = not tested)  LOWER EXTREMITY MMT:    MMT Right eval Left eval  Hip flexion 3 3  Hip extension 3- 3+  Hip abduction 3 3+  Hip adduction    Hip  internal rotation    Hip external rotation    Knee flexion    Knee extension    Ankle dorsiflexion    Ankle plantarflexion  Ankle inversion    Ankle eversion     (Blank rows = not tested)  LUMBAR SPECIAL TESTS:  Straight leg raise test: Negative  FUNCTIONAL TESTS:  5 times sit to stand: 13.26 seconds, knee pain 2 minute walk test: 355 feet, no AD  GAIT: Distance walked: 440 Assistive device utilized: None Level of assistance: Complete Independence Comments: decreased performance of RLE compared to the LLE, decreased stride length and trunk lean to the right noted  TREATMENT DATE:  07/09/24: SIJ assessment: in alignment Supine: TrA isometric 10X5  TrA activation with alternating bridge 10X LTR 10x 10 encouraged pain free range Bridge 10x slow controlled SLR with TrA, small ROM Sidelying:  Clams 10X3 each Standing:  hip abduction 10X each Hip extension 10X each Nustep UE/LE seat 8 level 3, 5 minutes   07/05/24: Reviewed goals Educated importance of HEP compliance with  SLS Lt 24 pain both hips, Rt 5  Supine: log rolling mechanics LTR 10x 10 encouraged pain free range Posterior pelvic tilt with verbal and tactile cueing 10x 3-5 Marching with TrA activation 5x 5 Bridge 10x  Sidelying: Clam with RTB 7x 5  07/02/2024   Evaluation: -ROM measured, Strength assessed, HEP prescribed, pt educated on prognosis, findings, and importance of HEP compliance if given.                                                                                                                                 PATIENT EDUCATION:  Education details: Pt was educated on findings of PT evaluation, prognosis, frequency of therapy visits and rationale, attendance policy, and HEP if given.   Person educated: Patient Education method: Explanation and Handouts Education comprehension: verbalized understanding and needs further education  HOME EXERCISE PROGRAM: Access Code:  [email or web address] URL: [email or web address] Date: 07/02/2024 Prepared by: Lang Ada  Exercises - Clamshell  - 1 x daily - 7 x weekly - 3 sets - 10 reps - Supine Bridge  - 1 x daily - 7 x weekly - 3 sets - 10 reps - 3 hold - Supine Lower Trunk Rotation  - 1 x daily - 7 x weekly - 3 sets - 10 reps - Supine Posterior Pelvic Tilt  - 1 x daily - 7 x weekly - 3 sets - 10 reps - 3 hold  ASSESSMENT:  CLINICAL IMPRESSION: 07/09/24:  Began session with check of SIJ alignment.  Everything appears to be in good alignment without need of muscle energy technique.  Discussed current Vertigo issue and informed we could treat that here also if her primary wanted to send over referral.  Pt with overall good form and minimal cues with established therex.  Added bridge in small ROM with core stabilization and continued with clams with added band to help strengthening hip abductors.  Standing exercises also added for glutes and abductors.  Finished session out with nustep. Pt without any dizzy spells today, however did complete all transfers slowly and  controlled. Pt reported no change in pain level or symptoms at end of session.  Pt will continue to benefit from skilled therapy.  Eval:  Patient is a 58 y.o. female who was seen today for physical therapy evaluation and treatment for M47.816 (ICD-10-CM) - Spondylosis without myelopathy or radiculopathy, lumbar region.  Patient demonstrates increased low back pain, decreased LE/core strength, abnormal pain with palpation of low back segments and bilateral hips, and impaired functional mobility. Patient also demonstrates abnormal gait with decreased stride length of RLE, R trunk lean and decreased speed during today's session. Patient also demonstrates RLE strength deficits compared to the left side although both have significant deficits. Patient requires education on role of PT, importance of increased physical activity, HEP compliance and overall POC. Patient  would benefit from skilled physical therapy for decreased low back pain, increased endurance with ambulation, increased LE/core strength, and balance for improved gait quality, return to higher level of function with ADLs, and progress towards therapy goals.   OBJECTIVE IMPAIRMENTS: Abnormal gait, decreased activity tolerance, decreased balance, decreased endurance, decreased knowledge of condition, decreased mobility, difficulty walking, decreased ROM, decreased strength, hypomobility, and pain.   ACTIVITY LIMITATIONS: carrying, lifting, bending, standing, squatting, stairs, transfers, and bed mobility  PARTICIPATION LIMITATIONS: meal prep, cleaning, laundry, shopping, community activity, and yard work  PERSONAL FACTORS: Age, Fitness, Past/current experiences, and Time since onset of injury/illness/exacerbation are also affecting patient's functional outcome.   REHAB POTENTIAL: Fair Chronic in nature  CLINICAL DECISION MAKING: Stable/uncomplicated  EVALUATION COMPLEXITY: Low   GOALS: Goals reviewed with patient? No  SHORT TERM GOALS: Target date: 07/23/24  Pt will be independent with HEP in order to demonstrate participation in Physical Therapy POC.  Baseline: Goal status: INITIAL  2.  Pt will report 1/10 pain with lumbar spine mobility in order to demonstrate improved pain with ADLs.  Baseline: 3/10 Goal status: INITIAL  LONG TERM GOALS: Target date: 08/13/24  Pt will improve 5TSTS time by at least 2.3 seconds in order to demonstrate improved functional strength to return to desired activities.  Baseline: see objective.  Goal status: INITIAL  2.  Pt will improve 2 MWT by at least 40 feet in order to demonstrate improved functional ambulatory capacity in community setting.  Baseline: see objective.  Goal status: INITIAL  3.  Pt will improve Modified Oswestry score by at least 6 points in order to demonstrate improved pain with functional goals and outcomes. Baseline: see  objective.  Goal status: INITIAL  4.  Pt will report 1/10 pain with mobility in order to demonstrate reduced pain with ADLs lasting greater than 30 minutes.  Baseline: see objective.  Goal status: INITIAL   PLAN:  PT FREQUENCY: 1-2x/week  PT DURATION: 6 weeks  PLANNED INTERVENTIONS: 97110-Therapeutic exercises, 97530- Therapeutic activity, 97112- Neuromuscular re-education, 97535- Self Care, [id number]- Manual therapy, [phone]- Gait training, [phone]- Electrical stimulation (unattended), [phone]- Electrical stimulation (manual), [phone] (1-2 muscles), [phone] (3+ muscles)- Dry Needling, Patient/Family education, Balance training, Stair training, Joint mobilization, Joint manipulation, Spinal manipulation, Spinal mobilization, DME instructions, Cryotherapy, and Moist heat.  PLAN FOR NEXT SESSION: progress core and hip strengthening/mobility, progress walking and endurance training, incorporate hip and balance activities  Check SI with MET PRN.  Greig KATHEE Fuse, PTA/CLT [facility] Ph: [phone]  9:48 AM, 07/09/24   "

## 2024-07-12 ENCOUNTER — Ambulatory Visit (HOSPITAL_COMMUNITY): Admitting: Physical Therapy

## 2024-07-16 ENCOUNTER — Ambulatory Visit (HOSPITAL_COMMUNITY): Admitting: Physical Therapy

## 2024-07-18 ENCOUNTER — Ambulatory Visit (HOSPITAL_COMMUNITY)

## 2024-07-18 ENCOUNTER — Encounter (HOSPITAL_COMMUNITY): Payer: Self-pay

## 2024-07-18 DIAGNOSIS — M5459 Other low back pain: Secondary | ICD-10-CM | POA: Diagnosis not present

## 2024-07-18 DIAGNOSIS — Z7409 Other reduced mobility: Secondary | ICD-10-CM

## 2024-07-18 DIAGNOSIS — R29898 Other symptoms and signs involving the musculoskeletal system: Secondary | ICD-10-CM

## 2024-07-18 NOTE — Therapy (Signed)
 " OUTPATIENT PHYSICAL THERAPY THORACOLUMBAR TREATMENT   Patient Name: Annette Dunn MRN: [id number] DOB:[birth year], 59 y.o., female Today's Date: 07/18/2024  END OF SESSION:  PT End of Session - 07/18/24 1115     Visit Number 3    Number of Visits 12    Date for Recertification  08/20/24    Authorization Type [employer]    Authorization Time Period no auth required    Progress Note Due on Visit 10    PT Start Time 1115    PT Stop Time 1155    PT Time Calculation (min) 40 min    Activity Tolerance Patient tolerated treatment well    Behavior During Therapy WFL for tasks assessed/performed           Past Medical History:  Diagnosis Date   Acute sinusitis    Allergic rhinitis    Back pain    BMI 35.0-35.9,adult    Cyst of nasal sinus    Deviated nasal septum    Drug-induced GI disturbance    Hidradenitis    Hypertension    Impaired fasting glucose    Muscle spasm of back    PONV (postoperative nausea and vomiting)    Sore throat    Strep pharyngitis    SVT (supraventricular tachycardia)    Vitamin D  deficiency    Past Surgical History:  Procedure Laterality Date   ABDOMINAL HYSTERECTOMY     NASAL SEPTOPLASTY W/ TURBINOPLASTY Bilateral 07/28/2022   Procedure: NASAL SEPTOPLASTY WITH TURBINATE REDUCTION;  Surgeon: Luciano Standing, MD;  Location: [facility];  Service: ENT;  Laterality: Bilateral;   Patient Active Problem List   Diagnosis Date Noted   Genetic testing 02/14/2024   Chronic migraine without aura, with intractable migraine, so stated, with status migrainosus 12/06/2023   Migraine with aura and without status migrainosus, not intractable 12/06/2023   MSH6 gene mutation positive 06/14/2023   Essential (primary) hypertension 01/12/2023   Palpitations 01/12/2023   Elevated coronary artery calcium  score 01/12/2023   Prediabetes 01/12/2023    PCP: Lonna Millman, DO   REFERRING PROVIDER: Arminda Das, MD  REFERRING DIAG: [phone]  (ICD-10-CM) - Spondylosis without myelopathy or radiculopathy, lumbar region  Rationale for Evaluation and Treatment: Rehabilitation  THERAPY DIAG:  Other low back pain  Impaired functional mobility, balance, gait, and endurance  Decreased strength of lower extremity  ONSET DATE: 2000  SUBJECTIVE:                                                                                                                                                                                           SUBJECTIVE STATEMENT:  Pt states she is actually feeling better and is surprised as she almost slipped out of her car yesterday. 2/10 soreness in left hip and back reported today. Pt states HEP is getting easier, left hip still sore sometimes.     Eval:  Pt states she has been dealing with this for years, since 2000. Pt states she was having increased pain with running so she stopped but now is hurting more and more. Pt states she has increased pain and needs increased time for ADLs. Pt states she also has neuropathy in both feet. Pt states she is trying to lose weight and would like to be able to exercise more.   PERTINENT HISTORY:  On weight loss medicine Migraines Has had cortisone shots in 2018-2019 with minimal relieve HTN, medicated Heart flutters Goiter on thyroid   PAIN:  Are you having pain? Yes: NPRS scale: 3/10, at worst in last week 5/10 Pain location: low back, right and left Pain description: constant, dull pain Aggravating factors: bending, squatting, lifting, prolonged walking bothers it Relieving factors: medications, heat  PRECAUTIONS: None  RED FLAGS: None   WEIGHT BEARING RESTRICTIONS: No  FALLS:  Has patient fallen in last 6 months? Yes. 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  DIAGNOSTIC FINDINGS:  EXAM DESCRIPTION: MR BRAIN W WO CONTRAST   CLINICAL HISTORY: Headache, increasing frequency or severity   COMPARISON: None available   TECHNIQUE: Non contrast MRI of the brain is performed according to our usual protocol including multiplanar multi sequence technique.   FINDINGS: No acute intracranial hemorrhage, mass, edema, or hydrocephalus. No areas of abnormal enhancement. No significant cortical atrophy or encephalomalacia. Moderate periventricular and subcortical white matter disease which is nonspecific. No associated enhancement. The vascular flow voids are unremarkable. No significant sinus disease.   IMPRESSION: Moderate nonspecific white matter disease. Findings could reflect chronic benign ischemic demyelination. This can also be seen with headaches/migraines. I feel an autoimmune demyelinating process would be less likely given the location and morphology of disease.  PATIENT SURVEYS:  Modified Oswestry: 24 / 50 = 48.0 %      SENSATION: Light touch: Impaired , neuropathy started in October   POSTURE: rounded shoulders, forward head, and flexed trunk   PALPATION: Increased tenderness noted L2-L5 with CPA and UPA, increased tenderness on R side, tenderness also noted to bilateral greater trochanters (R worse) and same with sciatic region bilaterally.  LUMBAR ROM:   AROM eval  Flexion 33, more pain  Extension 15, little pain  Right lateral flexion 15, slight pain in hip  Left lateral flexion 10, worse pain in hip  Right rotation WFL  Left rotation WFL   (Blank rows = not  tested)  LOWER EXTREMITY ROM:     Active  Right eval Left eval  Hip flexion    Hip extension    Hip abduction    Hip adduction    Hip internal rotation    Hip external rotation  Knee flexion    Knee extension    Ankle dorsiflexion    Ankle plantarflexion    Ankle inversion    Ankle eversion     (Blank rows = not tested)  LOWER EXTREMITY MMT:    MMT Right eval Left eval  Hip flexion 3 3  Hip extension 3- 3+  Hip abduction 3 3+  Hip adduction    Hip internal rotation    Hip external rotation    Knee flexion    Knee extension    Ankle dorsiflexion    Ankle plantarflexion    Ankle inversion    Ankle eversion     (Blank rows = not tested)  LUMBAR SPECIAL TESTS:  Straight leg raise test: Negative  FUNCTIONAL TESTS:  5 times sit to stand: 13.26 seconds, knee pain 2 minute walk test: 355 feet, no AD  GAIT: Distance walked: 440 Assistive device utilized: None Level of assistance: Complete Independence Comments: decreased performance of RLE compared to the LLE, decreased stride length and trunk lean to the right noted  TREATMENT DATE:  07/18/2024  Therapeutic Exercise: -Treadmill, 5 minutes, grade 2.0 incline, speed 1.3>1.7 -Supine bridges with Enwright band at knees, 2 sets of 10 reps, 3 second holds, pt cued for max hip extension -Hip 3 way kicks, GTB at ankles, pt cued for core activation and increased ROM Neuromuscular Re-education: -Lower trunk rotations, 2 set of 10 reps, bilaterally, pt cued to remain in pain free ROM -Side plank, 1 set of 3 reps of 10 second holds, bilaterally, pt cued for increased hip elevation and proper LE/UE placement -Modified crunch, 2 set of 10 reps, 3 second holds, pt cued for sequencing and UE/LE placement -Bird dog, 1 set of 8 reps, bilaterally, pt cued for increased hip ROM and neutral spine throughout movement Therapeutic Activity: -Lateral stepping, 2 laps, 20 feet per lap, with GTB around ankles, pt cued for upright posture  and slight bend in knees -Sit to stands with kettle bell and shoulder press, 2 sets of 6 reps, pt cued for core activation and keeping weight close to chest   07/09/24: SIJ assessment: in alignment Supine: TrA isometric 10X5  TrA activation with alternating bridge 10X LTR 10x 10 encouraged pain free range Bridge 10x slow controlled SLR with TrA, small ROM Sidelying:  Clams 10X3 each Standing:  hip abduction 10X each Hip extension 10X each Nustep UE/LE seat 8 level 3, 5 minutes   07/05/24: Reviewed goals Educated importance of HEP compliance with  SLS Lt 24 pain both hips, Rt 5  Supine: log rolling mechanics LTR 10x 10 encouraged pain free range Posterior pelvic tilt with verbal and tactile cueing 10x 3-5 Marching with TrA activation 5x 5 Bridge 10x  Sidelying: Clam with RTB 7x 5  PATIENT EDUCATION:  Education details: Pt was educated on findings of PT evaluation, prognosis, frequency of therapy visits and rationale, attendance policy, and HEP if given.   Person educated: Patient Education method: Explanation and Handouts Education comprehension: verbalized understanding and needs further education  HOME EXERCISE PROGRAM: Access Code: [email or web address] URL: [email or web address] Date: 07/02/2024 Prepared by: Lang Ada  Exercises - Clamshell  - 1 x daily - 7 x weekly - 3 sets - 10 reps - Supine Bridge  - 1 x daily - 7 x weekly - 3 sets - 10 reps - 3 hold - Supine Lower Trunk Rotation  - 1 x daily - 7 x weekly - 3 sets - 10 reps - Supine Posterior Pelvic Tilt  - 1 x daily - 7 x weekly - 3 sets - 10 reps - 3 hold  ASSESSMENT:  CLINICAL IMPRESSION: Patient continues to demonstrate increased pain in low back and R proximal LE, decreased core and proximal LE strength, decreased gait quality and impaired balance. Patient also  demonstrates fair endurance with core endurance exercise during today's session with side planks and modified crunches. Patient able to progress dynamic balance and core activation exercises today with STS variation and banded walking, good performance with verbal cueing. Patient educated on importance of consistent HEP compliance and increased core strengthening. Patient would continue to benefit from skilled physical therapy for decreased low back pain, increased endurance with ambulation, increased core and LE mobility, and improved balance for improved quality of life, improved independence with management of low back and continued progress towards therapy goals.   Eval:  Patient is a 59 y.o. female who was seen today for physical therapy evaluation and treatment for M47.816 (ICD-10-CM) - Spondylosis without myelopathy or radiculopathy, lumbar region.  Patient demonstrates increased low back pain, decreased LE/core strength, abnormal pain with palpation of low back segments and bilateral hips, and impaired functional mobility. Patient also demonstrates abnormal gait with decreased stride length of RLE, R trunk lean and decreased speed during today's session. Patient also demonstrates RLE strength deficits compared to the left side although both have significant deficits. Patient requires education on role of PT, importance of increased physical activity, HEP compliance and overall POC. Patient would benefit from skilled physical therapy for decreased low back pain, increased endurance with ambulation, increased LE/core strength, and balance for improved gait quality, return to higher level of function with ADLs, and progress towards therapy goals.   OBJECTIVE IMPAIRMENTS: Abnormal gait, decreased activity tolerance, decreased balance, decreased endurance, decreased knowledge of condition, decreased mobility, difficulty walking, decreased ROM, decreased strength, hypomobility, and pain.   ACTIVITY  LIMITATIONS: carrying, lifting, bending, standing, squatting, stairs, transfers, and bed mobility  PARTICIPATION LIMITATIONS: meal prep, cleaning, laundry, shopping, community activity, and yard work  PERSONAL FACTORS: Age, Fitness, Past/current experiences, and Time since onset of injury/illness/exacerbation are also affecting patient's functional outcome.   REHAB POTENTIAL: Fair Chronic in nature  CLINICAL DECISION MAKING: Stable/uncomplicated  EVALUATION COMPLEXITY: Low   GOALS: Goals reviewed with patient? No  SHORT TERM GOALS: Target date: 07/23/24  Pt will be independent with HEP in order to demonstrate participation in Physical Therapy POC.  Baseline: Goal status: INITIAL  2.  Pt will report 1/10 pain with lumbar spine mobility in order to demonstrate improved pain with ADLs.  Baseline: 3/10 Goal status: INITIAL  LONG TERM GOALS: Target date: 08/13/24  Pt will improve 5TSTS time by at least 2.3 seconds in order to demonstrate improved functional strength to return to desired activities.  Baseline: see objective.  Goal status: INITIAL  2.  Pt will improve 2 MWT by at least 40 feet in order to demonstrate improved functional ambulatory capacity in community setting.  Baseline: see objective.  Goal status: INITIAL  3.  Pt will improve Modified Oswestry score by at least 6 points in order to demonstrate improved pain with functional goals and outcomes. Baseline: see objective.  Goal status: INITIAL  4.  Pt will report 1/10 pain with mobility in order to demonstrate reduced pain with ADLs lasting greater than 30 minutes.  Baseline: see objective.  Goal status: INITIAL   PLAN:  PT FREQUENCY: 1-2x/week  PT DURATION: 6 weeks  PLANNED INTERVENTIONS: 97110-Therapeutic exercises, 97530- Therapeutic activity, 97112- Neuromuscular re-education, 97535- Self Care, [id number]- Manual therapy, [phone]- Gait training, [phone]- Electrical stimulation (unattended), [phone]- Electrical  stimulation (manual), [phone] (1-2 muscles), [phone] (3+ muscles)- Dry Needling, Patient/Family education, Balance training, Stair training, Joint mobilization, Joint manipulation, Spinal manipulation, Spinal mobilization, DME instructions, Cryotherapy, and Moist heat.  PLAN FOR NEXT SESSION: progress core and hip strengthening/mobility, progress walking and endurance training, incorporate hip and balance activities  Lang Ada, PT, DPT [facility] Office: [phone] 11:16 AM, 07/18/24   "

## 2024-07-23 ENCOUNTER — Ambulatory Visit (HOSPITAL_COMMUNITY): Admitting: Physical Therapy

## 2024-07-25 ENCOUNTER — Ambulatory Visit (HOSPITAL_COMMUNITY): Admitting: Physical Therapy

## 2024-07-25 DIAGNOSIS — Z7409 Other reduced mobility: Secondary | ICD-10-CM

## 2024-07-25 DIAGNOSIS — R29898 Other symptoms and signs involving the musculoskeletal system: Secondary | ICD-10-CM

## 2024-07-25 DIAGNOSIS — M5459 Other low back pain: Secondary | ICD-10-CM

## 2024-07-25 NOTE — Therapy (Signed)
 " OUTPATIENT PHYSICAL THERAPY THORACOLUMBAR TREATMENT   Patient Name: Annette Dunn MRN: [id number] DOB:1965/10/12, 59 y.o., female Today's Date: 07/25/2024  END OF SESSION:  PT End of Session - 07/25/24 0953     Visit Number 4    Number of Visits 12    Date for Recertification  08/20/24    Authorization Type [employer]    Authorization Time Period no auth required    Progress Note Due on Visit 10    PT Start Time 0949    PT Stop Time 1029    PT Time Calculation (min) 40 min    Activity Tolerance Patient tolerated treatment well    Behavior During Therapy WFL for tasks assessed/performed           Past Medical History:  Diagnosis Date   Acute sinusitis    Allergic rhinitis    Back pain    BMI 35.0-35.9,adult    Cyst of nasal sinus    Deviated nasal septum    Drug-induced GI disturbance    Hidradenitis    Hypertension    Impaired fasting glucose    Muscle spasm of back    PONV (postoperative nausea and vomiting)    Sore throat    Strep pharyngitis    SVT (supraventricular tachycardia)    Vitamin D  deficiency    Past Surgical History:  Procedure Laterality Date   ABDOMINAL HYSTERECTOMY     NASAL SEPTOPLASTY W/ TURBINOPLASTY Bilateral 07/28/2022   Procedure: NASAL SEPTOPLASTY WITH TURBINATE REDUCTION;  Surgeon: Luciano Standing, MD;  Location: [facility];  Service: ENT;  Laterality: Bilateral;   Patient Active Problem List   Diagnosis Date Noted   Genetic testing 02/14/2024   Chronic migraine without aura, with intractable migraine, so stated, with status migrainosus 12/06/2023   Migraine with aura and without status migrainosus, not intractable 12/06/2023   MSH6 gene mutation positive 06/14/2023   Essential (primary) hypertension 01/12/2023   Palpitations 01/12/2023   Elevated coronary artery calcium  score 01/12/2023   Prediabetes 01/12/2023    PCP: Lonna Millman, DO   REFERRING PROVIDER: Arminda Das, MD  REFERRING DIAG: [phone] (ICD-10-CM)  - Spondylosis without myelopathy or radiculopathy, lumbar region  Rationale for Evaluation and Treatment: Rehabilitation  THERAPY DIAG:  Other low back pain  Impaired functional mobility, balance, gait, and endurance  Decreased strength of lower extremity  ONSET DATE: 2000  SUBJECTIVE:                                                                                                                                                                                           SUBJECTIVE STATEMENT:  Pt states she is doing well today. 2/10 soreness presist in left hip and back reported today. Compliance with HEP.    Eval:  Pt states she has been dealing with this for years, since 2000. Pt states she was having increased pain with running so she stopped but now is hurting more and more. Pt states she has increased pain and needs increased time for ADLs. Pt states she also has neuropathy in both feet. Pt states she is trying to lose weight and would like to be able to exercise more.   PERTINENT HISTORY:  On weight loss medicine Migraines Has had cortisone shots in 2018-2019 with minimal relieve HTN, medicated Heart flutters Goiter on thyroid   PAIN:  Are you having pain? Yes: NPRS scale: 3/10, at worst in last week 5/10 Pain location: low back, right and left Pain description: constant, dull pain Aggravating factors: bending, squatting, lifting, prolonged walking bothers it Relieving factors: medications, heat  PRECAUTIONS: None  RED FLAGS: None   WEIGHT BEARING RESTRICTIONS: No  FALLS:  Has patient fallen in last 6 months? Yes. 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  DIAGNOSTIC FINDINGS:  EXAM DESCRIPTION: MR BRAIN W WO CONTRAST   CLINICAL HISTORY: Headache, increasing frequency or severity   COMPARISON: None available   TECHNIQUE: Non contrast MRI of the brain is performed according to our usual protocol including multiplanar multi sequence technique.   FINDINGS: No acute intracranial hemorrhage, mass, edema, or hydrocephalus. No areas of abnormal enhancement. No significant cortical atrophy or encephalomalacia. Moderate periventricular and subcortical white matter disease which is nonspecific. No associated enhancement. The vascular flow voids are unremarkable. No significant sinus disease.   IMPRESSION: Moderate nonspecific white matter disease. Findings could reflect chronic benign ischemic demyelination. This can also be seen with headaches/migraines. I feel an autoimmune demyelinating process would be less likely given the location and morphology of disease.  PATIENT SURVEYS:  Modified Oswestry: 24 / 50 = 48.0 %      SENSATION: Light touch: Impaired , neuropathy started in October   POSTURE: rounded shoulders, forward head, and flexed trunk   PALPATION: Increased tenderness noted L2-L5 with CPA and UPA, increased tenderness on R side, tenderness also noted to bilateral greater trochanters (R worse) and same with sciatic region bilaterally.  LUMBAR ROM:   AROM eval  Flexion 33, more pain  Extension 15, little pain  Right lateral flexion 15, slight pain in hip  Left lateral flexion 10, worse pain in hip  Right rotation WFL  Left rotation WFL   (Blank rows = not tested)  LOWER EXTREMITY ROM:     Active  Right eval Left eval  Hip flexion    Hip extension    Hip abduction    Hip  adduction    Hip internal rotation    Hip external rotation    Knee flexion    Knee extension    Ankle dorsiflexion    Ankle plantarflexion  Ankle inversion    Ankle eversion     (Blank rows = not tested)  LOWER EXTREMITY MMT:    MMT Right eval Left eval  Hip flexion 3 3  Hip extension 3- 3+  Hip abduction 3 3+  Hip adduction    Hip internal rotation    Hip external rotation    Knee flexion    Knee extension    Ankle dorsiflexion    Ankle plantarflexion    Ankle inversion    Ankle eversion     (Blank rows = not tested)  LUMBAR SPECIAL TESTS:  Straight leg raise test: Negative  FUNCTIONAL TESTS:  5 times sit to stand: 13.26 seconds, knee pain 2 minute walk test: 355 feet, no AD  GAIT: Distance walked: 440 Assistive device utilized: None Level of assistance: Complete Independence Comments: decreased performance of RLE compared to the LLE, decreased stride length and trunk lean to the right noted  TREATMENT DATE:  07/25/24: Supine: TrA isometric 10X5  TrA activation with alternating bridge 10X LTR 10x 10 encouraged pain free range Bridge 10x slow controlled SLR with TrA, small ROM Sidelying:  Clams 10X3 each  Hip abduction 10X each Quadruped: bird dogs 2X10 each Standing:  hip abduction 10X each Hip extension 10X each Vectors, 3 way kick 5X5  Side stepping with GTB 40 feet 2RT at thighs Nustep UE/LE seat 8 level 3, 5 minutes at EOS   07/18/2024  Therapeutic Exercise: -Treadmill, 5 minutes, grade 2.0 incline, speed 1.3>1.7 -Supine bridges with Zenner band at knees, 2 sets of 10 reps, 3 second holds, pt cued for max hip extension -Hip 3 way kicks, GTB at ankles, pt cued for core activation and increased ROM Neuromuscular Re-education: -Lower trunk rotations, 2 set of 10 reps, bilaterally, pt cued to remain in pain free ROM -Side plank, 1 set of 3 reps of 10 second holds, bilaterally, pt cued for increased hip elevation and proper LE/UE  placement -Modified crunch, 2 set of 10 reps, 3 second holds, pt cued for sequencing and UE/LE placement -Bird dog, 1 set of 8 reps, bilaterally, pt cued for increased hip ROM and neutral spine throughout movement Therapeutic Activity: -Lateral stepping, 2 laps, 20 feet per lap, with GTB around ankles, pt cued for upright posture and slight bend in knees -Sit to stands with kettle bell and shoulder press, 2 sets of 6 reps, pt cued for core activation and keeping weight close to chest   07/09/24: SIJ assessment: in alignment Supine: TrA isometric 10X5  TrA activation with alternating bridge 10X LTR 10x 10 encouraged pain free range Bridge 10x slow controlled SLR with TrA, small ROM Sidelying:  Clams 10X3 each Standing:  hip abduction 10X each Hip extension 10X each Nustep UE/LE seat 8 level 3, 5 minutes   07/05/24: Reviewed goals Educated importance of HEP compliance with  SLS Lt 24 pain both hips, Rt 5  Supine: log rolling mechanics LTR 10x 10 encouraged pain free range Posterior pelvic tilt with verbal and tactile cueing 10x 3-5 Marching with TrA activation 5x 5 Bridge 10x  Sidelying: Clam with RTB 7x 5  PATIENT EDUCATION:  Education details: Pt was educated on findings of PT evaluation, prognosis, frequency of therapy visits and rationale, attendance policy, and HEP if given.   Person educated: Patient Education method: Explanation and Handouts Education comprehension: verbalized understanding and needs further education  HOME EXERCISE PROGRAM: Access Code: [email or web address] URL: [email or web address] Date: 07/02/2024 Prepared by: Lang Ada  Exercises - Clamshell  - 1 x daily - 7 x weekly - 3 sets - 10 reps - Supine Bridge  - 1 x daily - 7 x weekly - 3 sets - 10 reps - 3 hold - Supine Lower Trunk Rotation  - 1 x daily - 7 x  weekly - 3 sets - 10 reps - Supine Posterior Pelvic Tilt  - 1 x daily - 7 x weekly - 3 sets - 10 reps - 3 hold  ASSESSMENT:  CLINICAL IMPRESSION: Continued with focus on improving LE strength and stabilization.  Began with mat activities, cautious with bed mobility due to vertigo. Pt managed slowly without symptoms.  Additional set added with bird dog with cues for general form.  Standing vectors with increased hold times.  Side stepping with band appeared to fatigue pt quite a bit and remainder of session presented with slow mobility.  Finished on nustep.  No pain reported but did show general fatigues.  Patient will continue to benefit from skilled physical therapy for decreased low back pain, increased endurance with ambulation, increased core and LE mobility, and improved balance for improved quality of life, improved independence with management of low back and continued progress towards therapy goals.   Eval:  Patient is a 59 y.o. female who was seen today for physical therapy evaluation and treatment for M47.816 (ICD-10-CM) - Spondylosis without myelopathy or radiculopathy, lumbar region.  Patient demonstrates increased low back pain, decreased LE/core strength, abnormal pain with palpation of low back segments and bilateral hips, and impaired functional mobility. Patient also demonstrates abnormal gait with decreased stride length of RLE, R trunk lean and decreased speed during today's session. Patient also demonstrates RLE strength deficits compared to the left side although both have significant deficits. Patient requires education on role of PT, importance of increased physical activity, HEP compliance and overall POC. Patient would benefit from skilled physical therapy for decreased low back pain, increased endurance with ambulation, increased LE/core strength, and balance for improved gait quality, return to higher level of function with ADLs, and progress towards therapy goals.   OBJECTIVE  IMPAIRMENTS: Abnormal gait, decreased activity tolerance, decreased balance, decreased endurance, decreased knowledge of condition, decreased mobility, difficulty walking, decreased ROM, decreased strength, hypomobility, and pain.   ACTIVITY LIMITATIONS: carrying, lifting, bending, standing, squatting, stairs, transfers, and bed mobility  PARTICIPATION LIMITATIONS: meal prep, cleaning, laundry, shopping, community activity, and yard work  PERSONAL FACTORS: Age, Fitness, Past/current experiences, and Time since onset of injury/illness/exacerbation are also affecting patient's functional outcome.   REHAB POTENTIAL: Fair Chronic in nature  CLINICAL DECISION MAKING: Stable/uncomplicated  EVALUATION COMPLEXITY: Low   GOALS: Goals reviewed with patient? No  SHORT TERM GOALS: Target date: 07/23/24  Pt will be independent with HEP in order to demonstrate participation in Physical Therapy POC.  Baseline: Goal status: INITIAL  2.  Pt will report 1/10 pain with lumbar spine mobility in order to demonstrate improved pain with ADLs.  Baseline: 3/10 Goal status: INITIAL  LONG TERM GOALS: Target date: 08/13/24  Pt will improve 5TSTS time by at least 2.3 seconds in order to demonstrate improved functional strength to return to  desired activities.  Baseline: see objective.  Goal status: INITIAL  2.  Pt will improve 2 MWT by at least 40 feet in order to demonstrate improved functional ambulatory capacity in community setting.  Baseline: see objective.  Goal status: INITIAL  3.  Pt will improve Modified Oswestry score by at least 6 points in order to demonstrate improved pain with functional goals and outcomes. Baseline: see objective.  Goal status: INITIAL  4.  Pt will report 1/10 pain with mobility in order to demonstrate reduced pain with ADLs lasting greater than 30 minutes.  Baseline: see objective.  Goal status: INITIAL   PLAN:  PT FREQUENCY: 1-2x/week  PT DURATION: 6  weeks  PLANNED INTERVENTIONS: 97110-Therapeutic exercises, 97530- Therapeutic activity, 97112- Neuromuscular re-education, 97535- Self Care, [id number]- Manual therapy, [phone]- Gait training, [phone]- Electrical stimulation (unattended), [phone]- Electrical stimulation (manual), [phone] (1-2 muscles), [phone] (3+ muscles)- Dry Needling, Patient/Family education, Balance training, Stair training, Joint mobilization, Joint manipulation, Spinal manipulation, Spinal mobilization, DME instructions, Cryotherapy, and Moist heat.  PLAN FOR NEXT SESSION: progress core and hip strengthening/mobility, progress walking and endurance training, incorporate hip and balance activities  Greig KATHEE Fuse, PTA/CLT [facility] Ph: [phone] 10:33 AM, 07/25/24   "

## 2024-07-31 ENCOUNTER — Ambulatory Visit (HOSPITAL_COMMUNITY)

## 2024-08-02 ENCOUNTER — Ambulatory Visit (HOSPITAL_COMMUNITY)

## 2024-08-07 ENCOUNTER — Ambulatory Visit (HOSPITAL_COMMUNITY)

## 2024-08-09 ENCOUNTER — Ambulatory Visit (HOSPITAL_COMMUNITY)

## 2024-08-14 ENCOUNTER — Ambulatory Visit (HOSPITAL_COMMUNITY)

## 2024-08-17 ENCOUNTER — Ambulatory Visit: Admitting: Nurse Practitioner

## 2025-01-17 ENCOUNTER — Ambulatory Visit: Admitting: Adult Health
